# Patient Record
Sex: Female | Born: 1970 | Race: Black or African American | Hispanic: No | Marital: Single | Smoking: Never smoker
Health system: Southern US, Community
[De-identification: ages and names within clinical notes are randomized; demographics above are authoritative.]

## PROBLEM LIST (undated history)

## (undated) DIAGNOSIS — E119 Type 2 diabetes mellitus without complications: Secondary | ICD-10-CM

## (undated) DIAGNOSIS — E8881 Metabolic syndrome: Secondary | ICD-10-CM

## (undated) DIAGNOSIS — K5190 Ulcerative colitis, unspecified, without complications: Secondary | ICD-10-CM

## (undated) DIAGNOSIS — I10 Essential (primary) hypertension: Secondary | ICD-10-CM

## (undated) DIAGNOSIS — E785 Hyperlipidemia, unspecified: Secondary | ICD-10-CM

## (undated) HISTORY — PX: EYE SURGERY: SHX253

## (undated) HISTORY — DX: Essential (primary) hypertension: I10

## (undated) HISTORY — DX: Type 2 diabetes mellitus without complications: E11.9

## (undated) HISTORY — DX: Ulcerative colitis, unspecified, without complications: K51.90

## (undated) HISTORY — DX: Hyperlipidemia, unspecified: E78.5

---

## 2016-07-14 ENCOUNTER — Encounter (HOSPITAL_COMMUNITY): Payer: Self-pay

## 2016-07-14 ENCOUNTER — Emergency Department (HOSPITAL_COMMUNITY)
Admission: EM | Admit: 2016-07-14 | Discharge: 2016-07-14 | Disposition: A | Discharge status: Home / Self Care | Payer: PRIVATE HEALTH INSURANCE | Attending: Emergency Medicine | Admitting: Emergency Medicine

## 2016-07-14 DIAGNOSIS — E1165 Type 2 diabetes mellitus with hyperglycemia: Secondary | ICD-10-CM | POA: Insufficient documentation

## 2016-07-14 DIAGNOSIS — M79605 Pain in left leg: Secondary | ICD-10-CM | POA: Insufficient documentation

## 2016-07-14 DIAGNOSIS — R739 Hyperglycemia, unspecified: Secondary | ICD-10-CM

## 2016-07-14 HISTORY — DX: Metabolic syndrome: E88.81

## 2016-07-14 HISTORY — DX: Ulcerative colitis, unspecified, without complications: K51.90

## 2016-07-14 HISTORY — DX: Metabolic syndrome: E88.810

## 2016-07-14 LAB — URINALYSIS, ROUTINE W REFLEX MICROSCOPIC
Bilirubin Urine: NEGATIVE
HGB URINE DIPSTICK: NEGATIVE
KETONES UR: 5 mg/dL — AB
NITRITE: NEGATIVE
PH: 5 (ref 5.0–8.0)
Protein, ur: NEGATIVE mg/dL
SPECIFIC GRAVITY, URINE: 1.042 — AB (ref 1.005–1.030)

## 2016-07-14 LAB — BASIC METABOLIC PANEL
ANION GAP: 11 (ref 5–15)
BUN: 10 mg/dL (ref 6–20)
CALCIUM: 9.5 mg/dL (ref 8.9–10.3)
CO2: 22 mmol/L (ref 22–32)
Chloride: 100 mmol/L — ABNORMAL LOW (ref 101–111)
Creatinine, Ser: 0.66 mg/dL (ref 0.44–1.00)
GFR calc non Af Amer: >60 mL/min (ref >60)
Glucose, Bld: 261 mg/dL — ABNORMAL HIGH (ref 65–99)
Potassium: 3.9 mmol/L (ref 3.5–5.1)
SODIUM: 133 mmol/L — AB (ref 135–145)

## 2016-07-14 LAB — CBC
HCT: 42.9 % (ref 36.0–46.0)
HEMOGLOBIN: 14.5 g/dL (ref 12.0–15.0)
MCH: 28.3 pg (ref 26.0–34.0)
MCHC: 33.8 g/dL (ref 30.0–36.0)
MCV: 83.6 fL (ref 78.0–100.0)
PLATELETS: 383 10*3/uL (ref 150–400)
RBC: 5.13 MIL/uL — AB (ref 3.87–5.11)
RDW: 12.8 % (ref 11.5–15.5)
WBC: 8.3 10*3/uL (ref 4.0–10.5)

## 2016-07-14 LAB — CBG MONITORING, ED
Glucose-Capillary: 259 mg/dL — ABNORMAL HIGH (ref 65–99)
Glucose-Capillary: 220 mg/dL — ABNORMAL HIGH (ref 65–99)

## 2016-07-14 MED ORDER — IBUPROFEN 600 MG PO TABS: 600.0000 mg | ORAL_TABLET | Freq: Three times a day (TID) | ORAL | 0 refills | Status: DC | PRN

## 2016-07-14 MED ORDER — SODIUM CHLORIDE 0.9 % IV BOLUS (SEPSIS)
500.0000 mL | Freq: Once | INTRAVENOUS | Status: AC
  Administered 2016-07-14: 500 mL via INTRAVENOUS

## 2016-07-14 NOTE — Discharge Instructions (Signed)
Follow-up with a primary care doctor.

## 2016-07-14 NOTE — ED Triage Notes (Signed)
Per Pt, Pt is coming from home with complaints of hyperglycemia. Pt has hx of metabolic syndrome and reports body pain that has increased specifically in the left leg over the last two days. Pt reports some blurred vision.

## 2016-07-14 NOTE — ED Notes (Signed)
ED Provider at bedside. 

## 2016-07-14 NOTE — ED Provider Notes (Signed)
[facility] DEPT Provider Note   CSN: [id number] Arrival date & time: 07/14/16  1748  By signing my name below, I, Megan [name], attest that this documentation has been prepared under the direction and in the presence of Davonna Belling, MD.  Electronically Signed: Levester [name], Scribe. 07/14/2016. 7:30 PM.  History   Chief Complaint Chief Complaint  Patient presents with  . Hyperglycemia   Megan [name] is a 46 y.o. female with a PMHx consisting of DM on metformin, HTN, HDL, metabolic syndrome and UC who presents to the Emergency Department with multiple complaints, including hyperglycemia, abdominal pain, lower leg pain, nausea, confusion, blurry vision, increased urinary frequency and generalized weight-loss. x1 day of diarrhea, which resolved with no symptomatic treatment.  Pt denies experiencing any other acute sx, including vomiting.   Pt does not believe she is in an UC flare-up. Pt moved here 5 months ago and has not established routine medical care yet.  The history is provided by the patient. No language interpreter was used.   Past Medical History:  Diagnosis Date  . Metabolic syndrome   . Ulcerative colitis ([place])    There are no active problems to display for this patient.  Past Surgical History:  Procedure Laterality Date  . EYE SURGERY     OB History    No data available     Home Medications    Prior to Admission medications   Medication Sig Start Date End Date Taking? Authorizing Provider  ibuprofen (ADVIL,MOTRIN) 600 MG tablet Take 1 tablet (600 mg total) by mouth every 8 (eight) hours as needed. 07/14/16   Davonna Belling, MD   Family History No family history on file.  Social History Social History  Substance Use Topics  . Smoking status: Never Smoker  . Smokeless tobacco: Never Used  . Alcohol use Yes     Comment: occasionally    Allergies   Patient has no known allergies.  Review of Systems Review of Systems  Eyes:   Blurry vision  Gastrointestinal: Positive for abdominal pain, diarrhea (Resolved) and nausea. Negative for vomiting.  Genitourinary: Positive for frequency.  Musculoskeletal: Positive for myalgias.  Neurological:       Confusion   Physical Exam Updated Vital Signs BP 130/68 (BP Location: Left Arm)   Pulse 89   Temp 97.9 F (36.6 C) (Oral)   Resp 18   Ht 5' 10"  (1.778 m)   Wt 181 lb (82.1 kg)   LMP 07/09/2016   SpO2 98%   BMI 25.97 kg/m   Physical Exam  Constitutional: She is oriented to person, place, and time. She appears well-developed and well-nourished. No distress.  HENT:  Head: Normocephalic and atraumatic.  Cardiovascular: Normal rate.   Pulmonary/Chest: Effort normal.  Abdominal:  Mild suprapubic tenderness  Musculoskeletal:  No tenderness on the lower legs. Straight leg raise bilaterally. No peripheral edema.   Neurological: She is alert and oriented to person, place, and time.  Skin: Skin is warm and dry.  Psychiatric: She has a normal mood and affect.  Nursing note and vitals reviewed.  ED Treatments / Results  DIAGNOSTIC STUDIES: Oxygen Saturation is 98% on RA, nl by my interpretation.    COORDINATION OF CARE: 6:58 PM Discussed treatment plan with pt at bedside and pt agreed to plan. 8:01 PM Updated pt on current lab results. Awaiting urinalysis. She is pleasant and agrees with plan.   Labs (all labs ordered are listed, but only abnormal results are displayed) Labs Reviewed  BASIC METABOLIC PANEL - Abnormal; Notable for the following:       Result Value   Sodium 133 (*)    Chloride 100 (*)    Glucose, Bld 261 (*)    All other components within normal limits  CBC - Abnormal; Notable for the following:    RBC 5.13 (*)    All other components within normal limits  URINALYSIS, ROUTINE W REFLEX MICROSCOPIC - Abnormal; Notable for the following:    APPearance HAZY (*)    Specific Gravity, Urine 1.042 (*)    Glucose, UA >=500 (*)    Ketones, ur 5 (*)      Leukocytes, UA TRACE (*)    Bacteria, UA RARE (*)    Squamous Epithelial / LPF 0-5 (*)    All other components within normal limits  CBG MONITORING, ED - Abnormal; Notable for the following:    Glucose-Capillary 259 (*)    All other components within normal limits  CBG MONITORING, ED - Abnormal; Notable for the following:    Glucose-Capillary 220 (*)    All other components within normal limits   EKG  EKG Interpretation None      Radiology No results found.  Procedures Procedures (including critical care time)  Medications Ordered in ED Medications  sodium chloride 0.9 % bolus 500 mL (0 mLs Intravenous Stopped 07/14/16 2013)    Initial Impression / Assessment and Plan / ED Course  I have reviewed the triage vital signs and the nursing notes.  Pertinent labs & imaging results that were available during my care of the patient were reviewed by me and considered in my medical decision making (see chart for details).     Patient with mild hyperglycemia. Not in DKA. Labs overall reassuring. Does have some pain however in her left thigh. No trauma. No swelling of the leg. Overall benign exam. Doubt DVT with no swelling and really no tenderness. Give NSAIDs and will have follow-up with primary care doctor.  Final Clinical Impressions(s) / ED Diagnoses   Final diagnoses:  Hyperglycemia  Left leg pain    New Prescriptions New Prescriptions   IBUPROFEN (ADVIL,MOTRIN) 600 MG TABLET    Take 1 tablet (600 mg total) by mouth every 8 (eight) hours as needed.   I personally performed the services described in this documentation, which was scribed in my presence. The recorded information has been reviewed and is accurate.      Davonna Belling, MD 07/14/16 2027

## 2018-10-27 ENCOUNTER — Ambulatory Visit (INDEPENDENT_AMBULATORY_CARE_PROVIDER_SITE_OTHER): Payer: Managed Care, Other (non HMO) | Admitting: Family Medicine

## 2018-10-27 ENCOUNTER — Encounter: Payer: Self-pay | Admitting: Family Medicine

## 2018-10-27 ENCOUNTER — Other Ambulatory Visit: Payer: Self-pay

## 2018-10-27 VITALS — BP 132/80 | HR 84 | Temp 98.5°F | Ht 70.0 in | Wt 177.4 lb

## 2018-10-27 DIAGNOSIS — E785 Hyperlipidemia, unspecified: Secondary | ICD-10-CM

## 2018-10-27 DIAGNOSIS — E039 Hypothyroidism, unspecified: Secondary | ICD-10-CM

## 2018-10-27 DIAGNOSIS — I10 Essential (primary) hypertension: Secondary | ICD-10-CM | POA: Insufficient documentation

## 2018-10-27 DIAGNOSIS — E1169 Type 2 diabetes mellitus with other specified complication: Secondary | ICD-10-CM | POA: Diagnosis not present

## 2018-10-27 DIAGNOSIS — R928 Other abnormal and inconclusive findings on diagnostic imaging of breast: Secondary | ICD-10-CM | POA: Diagnosis not present

## 2018-10-27 DIAGNOSIS — S80861A Insect bite (nonvenomous), right lower leg, initial encounter: Secondary | ICD-10-CM

## 2018-10-27 DIAGNOSIS — R739 Hyperglycemia, unspecified: Secondary | ICD-10-CM | POA: Diagnosis not present

## 2018-10-27 DIAGNOSIS — E119 Type 2 diabetes mellitus without complications: Secondary | ICD-10-CM

## 2018-10-27 DIAGNOSIS — E0590 Thyrotoxicosis, unspecified without thyrotoxic crisis or storm: Secondary | ICD-10-CM | POA: Insufficient documentation

## 2018-10-27 DIAGNOSIS — W57XXXA Bitten or stung by nonvenomous insect and other nonvenomous arthropods, initial encounter: Secondary | ICD-10-CM

## 2018-10-27 LAB — MICROALBUMIN / CREATININE URINE RATIO
Creatinine,U: 55 mg/dL
Microalb Creat Ratio: 1.3 mg/g (ref 0.0–30.0)
Microalb, Ur: <0.7 mg/dL (ref 0.0–1.9)

## 2018-10-27 LAB — POCT GLYCOSYLATED HEMOGLOBIN (HGB A1C): Hemoglobin A1C: 10.7 % — AB (ref 4.0–5.6)

## 2018-10-27 LAB — TSH: TSH: 0.03 u[IU]/mL — ABNORMAL LOW (ref 0.35–4.50)

## 2018-10-27 MED ORDER — TRIAMCINOLONE ACETONIDE 0.1 % EX CREA: 1.0000 "application " | TOPICAL_CREAM | Freq: Every day | CUTANEOUS | 0 refills | Status: DC | PRN

## 2018-10-27 MED ORDER — EMPAGLIFLOZIN 25 MG PO TABS: 25.0000 mg | ORAL_TABLET | Freq: Every day | ORAL | 1 refills | Status: DC

## 2018-10-27 MED ORDER — ENALAPRIL MALEATE 10 MG PO TABS: 10.0000 mg | ORAL_TABLET | Freq: Every day | ORAL | 1 refills | Status: DC

## 2018-10-27 MED ORDER — SIMVASTATIN 10 MG PO TABS: 10.0000 mg | ORAL_TABLET | Freq: Every day | ORAL | 3 refills | Status: DC

## 2018-10-27 NOTE — Progress Notes (Signed)
HPI:   [name] is a 48 y.o. female, who is here today to establish care.  Former PCP: Megan [name] at [facility]. Last preventive routine visit: 2019  Chronic medical problems: Hypertension,DM II, UC,and hypothyroidism.  DM II,Dx in 2015. Her health insurance does not covered [place]. HgA1C done on 09/12/18 was 10.7. She is not checking BS's.  Concerns today: Medications refill.  HLD, she is on Simvastatin 10 mg daily.  08/2018 TC 206,LDL 137,HLD 43,and TG 140  HTN,she is on Enalapril 10 mg daily. Denies severe/frequent headache, visual changes, chest pain, dyspnea, palpitation, claudication, focal weakness, or edema. She does not check BP's. Last BMP in 08/2018: Cr 0.54 and e GFR 129.   Hypothyroidism: Currently Levothyroxine 150 mg daily.  + Goiter. Tolerating medication well, no side effects reported. She has not noted dysphagia, palpitations, abdominal pain, changes in bowel habits, tremor, cold/heat intolerance, or abnormal weight loss. 09/12/18 TSH low at 0.04. Abnormal thyroid abd peroxidases thyroglobulins.  On 09/12/18 she also had CBC done,normal except for palettes elevated at 421 and RBC's 5.13.  Today she requesting treatment for mosquito bites. Pruritic lesion scattered on lower and upper extremities. No fever,chills,body aches,or fatigue. She has not tried OTC medications.  Reporting Hx of abnormal mammogram. Birads 3, sh was due for mammogram in 05/2018. She has not noted breast masses,nipple discharge,or skin changes.  Review of Systems  Constitutional: Negative for activity change, appetite change, fatigue, fever and unexpected weight change.  HENT: Negative for mouth sores, nosebleeds and sore throat.   Eyes: Negative for redness and visual disturbance.  Respiratory: Negative for cough and wheezing.   Gastrointestinal: Negative for abdominal pain, nausea and vomiting.       Negative for changes in bowel habits.   Genitourinary: Negative for decreased urine volume, dysuria and hematuria.  Musculoskeletal: Negative for gait problem and myalgias.  Skin: Positive for rash. Negative for wound.  Neurological: Negative for syncope, facial asymmetry and numbness.  Rest see pertinent positives and negatives per HPI.   No current outpatient medications on file prior to visit.   No current facility-administered medications on file prior to visit.     Past Medical History:  Diagnosis Date  . Diabetes mellitus without complication ([place])   . Hyperlipidemia   . Hypertension   . Metabolic syndrome   . Ulcerative colitis ([place])   . Ulcerative colitis, acute ([place])    No Known Allergies  Family History  Problem Relation Age of Onset  . Asthma Mother   . COPD Mother   . Hypertension Mother     Social History   Socioeconomic History  . Marital status: Single    Spouse name: Not on file  . Number of children: 1  . Years of education: Not on file  . Highest education level: Not on file  Occupational History  . Not on file  Social Needs  . Financial resource strain: Not on file  . Food insecurity    Worry: Not on file    Inability: Not on file  . Transportation needs    Medical: Not on file    Non-medical: Not on file  Tobacco Use  . Smoking status: Never Smoker  . Smokeless tobacco: Never Used  Substance and Sexual Activity  . Alcohol use: Yes    Comment: occasionally   . Drug use: No  . Sexual activity: Not Currently  Lifestyle  . Physical activity    Days per week: Not on  file    Minutes per session: Not on file  . Stress: Not on file  Relationships  . Social Herbalist on phone: Not on file    Gets together: Not on file    Attends religious service: Not on file    Active member of club or organization: Not on file    Attends meetings of clubs or organizations: Not on file    Relationship status: Not on file  Other Topics Concern  . Not on file  Social History  Narrative  . Not on file    Vitals:   10/27/18 0835  BP: 132/80  Pulse: 84  Temp: 98.5 F (36.9 C)  SpO2: 96%    Body mass index is 25.45 kg/m.   Physical Exam  Nursing note and vitals reviewed. Constitutional: She is oriented to person, place, and time. She appears well-developed and well-nourished. No distress.  HENT:  Head: Normocephalic and atraumatic.  Mouth/Throat: Oropharynx is clear and moist and mucous membranes are normal.  Eyes: Pupils are equal, round, and reactive to light. Conjunctivae are normal.  Neck: Thyromegaly present.  Cardiovascular: Normal rate and regular rhythm.  No murmur heard. Pulses:      Dorsalis pedis pulses are 2+ on the right side and 2+ on the left side.  Respiratory: Effort normal and breath sounds normal. No respiratory distress.  GI: Soft. She exhibits no mass. There is no hepatomegaly. There is no abdominal tenderness.  Musculoskeletal:        General: No edema.  Lymphadenopathy:    She has no cervical adenopathy.  Neurological: She is alert and oriented to person, place, and time. She has normal strength. No cranial nerve deficit. Gait normal.  Skin: Skin is warm. Rash noted. Rash is urticarial. No erythema.     Right lower and upper extremities,urticarial like lesion, 1-1.5 cm.  Psychiatric: She has a normal mood and affect.  Well groomed, good eye contact.   Diabetic Foot Exam - Simple   Simple Foot Form Diabetic Foot exam was performed with the following findings: Yes 10/27/2018  9:19 AM  Visual Inspection No deformities, no ulcerations, no other skin breakdown bilaterally: Yes Sensation Testing Intact to touch and monofilament testing bilaterally: Yes Pulse Check Posterior Tibialis and Dorsalis pulse intact bilaterally: Yes Comments     ASSESSMENT AND PLAN:  Megan [name] was seen today for establish care.  Diagnoses and all orders for this visit: Lab Results  Component Value Date   HGBA1C 10.7 (A) 10/27/2018    Lab Results  Component Value Date   TSH 0.03 (L) 10/27/2018   Lab Results  Component Value Date   MICROALBUR <0.7 10/27/2018    Hypothyroidism, unspecified type TSH abnormal in 08/2018. No changes in current management, will follow TSH done today and will give further recommendations accordingly.  -     TSH -     levothyroxine (SYNTHROID) 125 MCG tablet; Take 1 tablet (125 mcg total) by mouth daily.  Hypertension, essential, benign Adequately controlled. No changes in current management. DASH diet recommended. Eye exam is due. F/U in 4 months, before if needed.  -     enalapril (VASOTEC) 10 MG tablet; Take 1 tablet (10 mg total) by mouth daily.  Type 2 diabetes mellitus with other specified complication, without long-term current use of insulin (HCC) HgA1C is not at goal. Jardiance 25 mg daily added. We may need another agent.  Regular exercise and healthy diet with avoidance of added sugar food  intake is an important part of treatment and recommended. Annual eye exam (needs appt), periodic dental and foot care recommended. F/U in 3 months  -     Microalbumin / creatinine urine ratio  Abnormal mammogram -     MM DIAG BREAST TOMO BILATERAL; Future  Hyperglycemia -     POC HgB A1c  Insect bite of multiple sites of right lower extremity with local reaction, initial encounter Prevention, consider repellents. Side effects of topical steroid,so recommend a small amount daily pr.  -     triamcinolone cream (KENALOG) 0.1 %; Apply 1 application topically daily as needed.  Hyperlipidemia associated with type 2 diabetes mellitus ([place]) Continue current management. Low fat diet also recommended. F/U in 02/2019.  -     simvastatin (ZOCOR) 10 MG tablet; Take 1 tablet (10 mg total) by mouth at bedtime.  Return in about 3 months (around 01/27/2019).    G. Martinique, MD  [facility]. [place] office.

## 2018-10-27 NOTE — Patient Instructions (Addendum)
A few things to remember from today's visit:   Diabetes mellitus without complication ([place]) - Plan: POC HgB A1c, Microalbumin / creatinine urine ratio  Hypothyroidism, unspecified type - Plan: TSH  Hypertension, essential, benign  Type 2 diabetes mellitus with other specified complication, without long-term current use of insulin (HCC)  Abnormal mammogram - Plan: MS DIGITAL DIAG TOMO BILAT  Jardiance was sent to your pharmacy. Simvastatin 10 mg added today. Rest of your medications are unchanged.  You need an eye exam.  Please be sure medication list is accurate. If a new problem present, please set up appointment sooner than planned today.

## 2018-10-28 ENCOUNTER — Telehealth: Payer: Self-pay | Admitting: Family Medicine

## 2018-10-28 NOTE — Telephone Encounter (Signed)
empagliflozin (JARDIANCE) 25 MG TABS tablet  [place] ([place]), [place] - [address] [phone] (Phone) [phone] (Fax)   Note this was sent in yesterday but pt states that when went for PU that PA is needed and she is completely out. Could this pls be expedited?

## 2018-10-28 NOTE — Telephone Encounter (Signed)
See note

## 2018-10-28 NOTE — Telephone Encounter (Signed)
Tried to call pt but no answer and mail box is full.

## 2018-10-31 MED ORDER — LEVOTHYROXINE SODIUM 125 MCG PO TABS: 125.0000 ug | ORAL_TABLET | Freq: Every day | ORAL | 0 refills | Status: DC

## 2018-11-01 ENCOUNTER — Ambulatory Visit: Payer: Self-pay | Admitting: Family Medicine

## 2018-11-02 ENCOUNTER — Other Ambulatory Visit: Payer: Self-pay | Admitting: Family Medicine

## 2018-11-02 DIAGNOSIS — R928 Other abnormal and inconclusive findings on diagnostic imaging of breast: Secondary | ICD-10-CM

## 2018-11-02 DIAGNOSIS — N6489 Other specified disorders of breast: Secondary | ICD-10-CM

## 2018-11-02 NOTE — Telephone Encounter (Signed)
PA started for medication.

## 2018-11-03 NOTE — Telephone Encounter (Signed)
Spoke with patient and informed her that PA has been initiated and waiting on decision from [employer]. Patient verbalized understanding.

## 2018-11-05 ENCOUNTER — Other Ambulatory Visit: Payer: Self-pay | Admitting: *Deleted

## 2018-11-05 DIAGNOSIS — E1169 Type 2 diabetes mellitus with other specified complication: Secondary | ICD-10-CM

## 2018-11-05 MED ORDER — FARXIGA 10 MG PO TABS: 10.0000 mg | ORAL_TABLET | Freq: Every day | ORAL | 3 refills | Status: DC

## 2018-11-05 NOTE — Telephone Encounter (Signed)
Spoke with patient and informed her that PA for Vania Rea was denied and that Dr. Martinique suggested FARXIGA 10 MG as an alternative.Patient verbalized understanding and requested Rx for Farxiga 10 mg be sent to the pharmacy. Rx sent.

## 2018-11-08 ENCOUNTER — Other Ambulatory Visit: Payer: Self-pay

## 2018-11-08 ENCOUNTER — Ambulatory Visit
Admission: RE | Admit: 2018-11-08 | Discharge: 2018-11-08 | Disposition: A | Discharge status: Home / Self Care | Payer: Managed Care, Other (non HMO) | Source: Ambulatory Visit | Attending: Family Medicine | Admitting: Family Medicine

## 2018-11-08 ENCOUNTER — Other Ambulatory Visit: Payer: Self-pay | Admitting: Family Medicine

## 2018-11-08 DIAGNOSIS — N6489 Other specified disorders of breast: Secondary | ICD-10-CM

## 2018-11-08 DIAGNOSIS — R928 Other abnormal and inconclusive findings on diagnostic imaging of breast: Secondary | ICD-10-CM

## 2018-11-08 MED ORDER — SITAGLIPTIN PHOS-METFORMIN HCL 50-1000 MG PO TABS: 1.0000 | ORAL_TABLET | Freq: Two times a day (BID) | ORAL | 0 refills | Status: DC

## 2018-11-09 ENCOUNTER — Other Ambulatory Visit: Payer: Self-pay | Admitting: *Deleted

## 2018-11-09 MED ORDER — METFORMIN HCL 500 MG PO TABS: 500.0000 mg | ORAL_TABLET | Freq: Two times a day (BID) | ORAL | 3 refills | Status: DC

## 2018-11-09 MED ORDER — OZEMPIC (0.25 OR 0.5 MG/DOSE) 2 MG/1.5ML ~~LOC~~ SOPN: 0.2500 mg | PEN_INJECTOR | SUBCUTANEOUS | 3 refills | Status: DC

## 2018-11-15 NOTE — Telephone Encounter (Signed)
Pt called back in, she said that there is a recall on Metformin, pt says that she was told by  her insurance that the information that is being provided by office isn't enough so that they are unable to approve Jardiance. Pt says that her insurance runs out on Sept 1 so she is really hoping to get a new medication by then. Pt would like to know if office could reach out to insurance to provide information that's needed for approval?

## 2018-11-16 ENCOUNTER — Other Ambulatory Visit: Payer: Self-pay | Admitting: *Deleted

## 2018-11-16 ENCOUNTER — Telehealth: Payer: Self-pay | Admitting: Family Medicine

## 2018-11-16 MED ORDER — JARDIANCE 25 MG PO TABS: 25.0000 mg | ORAL_TABLET | Freq: Every day | ORAL | 3 refills | Status: DC

## 2018-11-16 NOTE — Telephone Encounter (Signed)
Spoke with Vira Agar from [employer] and was able to get Megan [name] approved starting today for 1 year, reference # [id number]. Called patient and informed her that she should be able to pick up the medication from the pharmacy.

## 2018-11-16 NOTE — Telephone Encounter (Signed)
Noted. Patient aware.

## 2018-11-16 NOTE — Telephone Encounter (Signed)
Megan [name] with [employer].  States that Prior Auth for empagliflozin (JARDIANCE) 25 MG TABS tablet Has been approved indefinitely.  They will fax over information.

## 2018-11-16 NOTE — Telephone Encounter (Signed)
Spoke with Milta Deiters from [facility] and he stated that he would fax over a PA form to clinic for completion.

## 2018-11-17 NOTE — Telephone Encounter (Signed)
Spoke with patient and she stated that after Vania Rea was approved, the cost was $157 and she cannot afford that. Patient states that she started a new job and that insurance ended on 11/16/2018. Patient stated that she would take Metformin and Ozempic as previously advised before PA of Jardiance was done. Patient stated that she had some left in [place] that her daughter can bring back to her of the [facility]. Patient will pick up 2 boxes from the office.

## 2018-11-17 NOTE — Telephone Encounter (Signed)
Pt needs Megan [name] to return call asap, due to her Job this script needed to go in today, also still reaching out for price relief. Please return call at [phone]

## 2018-11-18 NOTE — Telephone Encounter (Signed)
Legrand Como from [employer] called and suggest [employer] They have a discount card with no ins. Sates pt no longer has insurance! [email or web address]

## 2018-11-30 NOTE — Telephone Encounter (Signed)
Jardiance 25 mg can be switched to Farxiga 10 mg. Thanks, BJ

## 2018-11-30 NOTE — Telephone Encounter (Signed)
Okay to switch?  

## 2018-12-01 ENCOUNTER — Other Ambulatory Visit: Payer: Self-pay | Admitting: *Deleted

## 2018-12-01 DIAGNOSIS — E1169 Type 2 diabetes mellitus with other specified complication: Secondary | ICD-10-CM

## 2018-12-01 MED ORDER — FARXIGA 10 MG PO TABS: 10.0000 mg | ORAL_TABLET | Freq: Every day | ORAL | 3 refills | Status: DC

## 2018-12-01 NOTE — Telephone Encounter (Signed)
Farxiga 10 mg sent to the pharmacy.

## 2019-02-25 ENCOUNTER — Other Ambulatory Visit: Payer: Self-pay

## 2019-02-25 DIAGNOSIS — Z20822 Contact with and (suspected) exposure to covid-19: Secondary | ICD-10-CM

## 2019-02-28 LAB — NOVEL CORONAVIRUS, NAA: SARS-CoV-2, NAA: NOT DETECTED

## 2019-03-01 ENCOUNTER — Telehealth: Payer: Self-pay | Admitting: General Practice

## 2019-03-01 NOTE — Telephone Encounter (Signed)
Negative COVID results given. Patient results "NOT Detected." Caller expressed understanding. ° °

## 2019-03-02 ENCOUNTER — Other Ambulatory Visit: Payer: Self-pay | Admitting: Family Medicine

## 2019-03-02 DIAGNOSIS — E039 Hypothyroidism, unspecified: Secondary | ICD-10-CM

## 2019-04-05 ENCOUNTER — Ambulatory Visit: Payer: PRIVATE HEALTH INSURANCE | Attending: Internal Medicine

## 2019-04-05 DIAGNOSIS — U071 COVID-19: Secondary | ICD-10-CM | POA: Insufficient documentation

## 2019-04-05 DIAGNOSIS — Z20822 Contact with and (suspected) exposure to covid-19: Secondary | ICD-10-CM

## 2019-04-06 LAB — NOVEL CORONAVIRUS, NAA: SARS-CoV-2, NAA: DETECTED — AB

## 2019-04-07 ENCOUNTER — Other Ambulatory Visit: Payer: Self-pay | Admitting: Physician Assistant

## 2019-04-07 DIAGNOSIS — U071 COVID-19: Secondary | ICD-10-CM

## 2019-04-07 DIAGNOSIS — E1169 Type 2 diabetes mellitus with other specified complication: Secondary | ICD-10-CM

## 2019-04-07 NOTE — Progress Notes (Signed)
[place],   You have been scheduled to receive bamlanivumab (the monoclonal antibody we discussed) on Sunday 04/10/19 at 1030 AM.  Please arrive 15 minutes early.   The address for the infusion clinic site is:  [place], [place] (previously this was the [facility]   When you drive in the main entrance you will see security -  tell them they are there to get an infusion and they will take you to where you need to go.   If you have questions please call [phone].   Should you develop worsening shortness of breath or symptoms over the holiday weekend please do not wait for this appointment and go to the Emergency room for evaluation and treatment.   The day of your visit you should: Marland Kitchen Get plenty of rest the night before and drink plenty of water . Eat a light meal/snack before coming and take your medications as prescribed  . Wear warm, comfortable clothes with a shirt that can roll-up over the elbow (will need IV start).  . Wear a mask  . Consider bringing some activity to help pass the time  I hope this helps find you feeling better,  Montey Hora, PA - C

## 2019-04-10 ENCOUNTER — Ambulatory Visit (HOSPITAL_COMMUNITY)
Admission: RE | Admit: 2019-04-10 | Discharge: 2019-04-10 | Disposition: A | Discharge status: Home / Self Care | Payer: PRIVATE HEALTH INSURANCE | Source: Ambulatory Visit | Attending: Pulmonary Disease | Admitting: Pulmonary Disease

## 2019-04-10 DIAGNOSIS — U071 COVID-19: Secondary | ICD-10-CM | POA: Diagnosis present

## 2019-04-10 DIAGNOSIS — Z23 Encounter for immunization: Secondary | ICD-10-CM | POA: Diagnosis not present

## 2019-04-10 DIAGNOSIS — E1169 Type 2 diabetes mellitus with other specified complication: Secondary | ICD-10-CM | POA: Diagnosis present

## 2019-04-10 MED ORDER — SODIUM CHLORIDE 0.9 % IV SOLN
INTRAVENOUS | Status: DC | PRN
  Administered 2019-04-10: 11:00:00 250 mL via INTRAVENOUS

## 2019-04-10 MED ORDER — EPINEPHRINE 0.3 MG/0.3ML IJ SOAJ: 0.3000 mg | Freq: Once | INTRAMUSCULAR | Status: DC | PRN

## 2019-04-10 MED ORDER — METHYLPREDNISOLONE SODIUM SUCC 125 MG IJ SOLR: 125.0000 mg | Freq: Once | INTRAMUSCULAR | Status: DC | PRN

## 2019-04-10 MED ORDER — DIPHENHYDRAMINE HCL 50 MG/ML IJ SOLN: 50.0000 mg | Freq: Once | INTRAMUSCULAR | Status: DC | PRN

## 2019-04-10 MED ORDER — ALBUTEROL SULFATE HFA 108 (90 BASE) MCG/ACT IN AERS: 2.0000 | INHALATION_SPRAY | Freq: Once | RESPIRATORY_TRACT | Status: DC | PRN

## 2019-04-10 MED ORDER — FAMOTIDINE IN NACL 20-0.9 MG/50ML-% IV SOLN: 20.0000 mg | Freq: Once | INTRAVENOUS | Status: DC | PRN

## 2019-04-10 MED ORDER — SODIUM CHLORIDE 0.9 % IV SOLN
700.0000 mg | Freq: Once | INTRAVENOUS | Status: AC
  Administered 2019-04-10: 12:00:00 700 mg via INTRAVENOUS
  Filled 2019-04-10: qty 20

## 2019-04-10 NOTE — Progress Notes (Signed)
  Diagnosis: COVID-19  Physician: Dr. Joya Gaskins Procedure: [facility]fusion [facility] Med: bamlanivimab infusion - Provided patient with bamlanimivab fact sheet for patients, parents and caregivers prior to infusion.  Complications: No immediate complications noted.  Discharge: Discharged home   Acquanetta Chain 04/10/2019

## 2019-04-10 NOTE — Discharge Instructions (Signed)

## 2019-06-08 ENCOUNTER — Other Ambulatory Visit: Payer: Self-pay | Admitting: Family Medicine

## 2019-06-08 DIAGNOSIS — E039 Hypothyroidism, unspecified: Secondary | ICD-10-CM

## 2019-06-10 ENCOUNTER — Ambulatory Visit (HOSPITAL_COMMUNITY)
Admission: EM | Admit: 2019-06-10 | Discharge: 2019-06-10 | Disposition: A | Discharge status: Home / Self Care | Payer: Self-pay

## 2019-06-10 ENCOUNTER — Encounter (HOSPITAL_COMMUNITY): Payer: Self-pay

## 2019-06-10 ENCOUNTER — Other Ambulatory Visit: Payer: Self-pay

## 2019-06-10 DIAGNOSIS — E1165 Type 2 diabetes mellitus with hyperglycemia: Secondary | ICD-10-CM

## 2019-06-10 DIAGNOSIS — H1032 Unspecified acute conjunctivitis, left eye: Secondary | ICD-10-CM

## 2019-06-10 DIAGNOSIS — R739 Hyperglycemia, unspecified: Secondary | ICD-10-CM

## 2019-06-10 DIAGNOSIS — H5789 Other specified disorders of eye and adnexa: Secondary | ICD-10-CM

## 2019-06-10 DIAGNOSIS — L568 Other specified acute skin changes due to ultraviolet radiation: Secondary | ICD-10-CM

## 2019-06-10 LAB — CBG MONITORING, ED: Glucose-Capillary: 349 mg/dL — ABNORMAL HIGH (ref 70–99)

## 2019-06-10 LAB — GLUCOSE, CAPILLARY: Glucose-Capillary: 349 mg/dL — ABNORMAL HIGH (ref 70–99)

## 2019-06-10 MED ORDER — TOBRAMYCIN 0.3 % OP SOLN: 2.0000 [drp] | OPHTHALMIC | 0 refills | Status: DC

## 2019-06-10 MED ORDER — EYE WASH OPHTH SOLN
OPHTHALMIC | Status: AC
  Filled 2019-06-10: qty 118

## 2019-06-10 NOTE — Discharge Instructions (Signed)
Make sure you contact your eye doctor today to request an urgent follow up appointment for your eye. For now, use the antibiotic eye drops to help with your eye infection/redness.

## 2019-06-10 NOTE — ED Provider Notes (Signed)
[place]   MRN: [id number] DOB: Dec 02, 1970  Subjective:   Megan [name] is a 49 y.o. female presenting for 3 day hx of acute onset red eye of left eye. Has also developed some photosensitivity.  Denies fever, eye drainage, pain with eye movement, runny or stuffy nose, throat pain, cough, eye trauma.  Denies vision change.  Does not wear contacts.  Patient is diabetic, would like to have her blood sugar checked.  No current facility-administered medications for this encounter.  Current Outpatient Medications:  .  Empagliflozin (JARDIANCE PO), Take by mouth., Disp: , Rfl:  .  enalapril (VASOTEC) 10 MG tablet, Take 1 tablet (10 mg total) by mouth daily., Disp: 90 tablet, Rfl: 1 .  levothyroxine (SYNTHROID) 125 MCG tablet, Take 1 tablet by mouth once daily, Disp: 90 tablet, Rfl: 0 .  metFORMIN (GLUCOPHAGE) 500 MG tablet, Take 500 mg by mouth 2 (two) times daily., Disp: , Rfl:  .  Semaglutide,0.25 or 0.5MG/DOS, (OZEMPIC, 0.25 OR 0.5 MG/DOSE,) 2 MG/1.5ML SOPN, Inject 0.25 mg into the skin once a week. 0.25 mg x 2 weeks then 0.5 mg weekly, Disp: 1.5 mL, Rfl: 3 .  simvastatin (ZOCOR) 10 MG tablet, Take 1 tablet (10 mg total) by mouth at bedtime., Disp: 90 tablet, Rfl: 3 .  dapagliflozin propanediol (FARXIGA) 10 MG TABS tablet, Take 10 mg by mouth daily before breakfast., Disp: 30 tablet, Rfl: 3 .  triamcinolone cream (KENALOG) 0.1 %, Apply 1 application topically daily as needed., Disp: 30 g, Rfl: 0   No Known Allergies  Past Medical History:  Diagnosis Date  . Diabetes mellitus without complication ([place])   . Hyperlipidemia   . Hypertension   . Metabolic syndrome   . Ulcerative colitis ([place])   . Ulcerative colitis, acute [facility])      Past Surgical History:  Procedure Laterality Date  . EYE SURGERY      Family History  Problem Relation Age of Onset  . Asthma Mother   . COPD Mother   . Hypertension Mother     Social History   Tobacco Use  . Smoking status:  Never Smoker  . Smokeless tobacco: Never Used  Substance Use Topics  . Alcohol use: Yes    Comment: occasionally   . Drug use: No    ROS   Objective:   Vitals: BP (!) 140/97 (BP Location: Left Arm)   Pulse 100   Temp 98.6 F (37 C) (Oral)   Resp 18   LMP 05/25/2019   SpO2 99%   Physical Exam Constitutional:      General: She is not in acute distress.    Appearance: Normal appearance. She is well-developed. She is not ill-appearing, toxic-appearing or diaphoretic.  HENT:     Head: Normocephalic and atraumatic.     Nose: Nose normal.     Mouth/Throat:     Mouth: Mucous membranes are moist.     Pharynx: Oropharynx is clear.  Eyes:     General: No scleral icterus.       Right eye: No foreign body, discharge or hordeolum.        Left eye: No foreign body, discharge or hordeolum.     Extraocular Movements: Extraocular movements intact.     Conjunctiva/sclera:     Right eye: Right conjunctiva is not injected. No chemosis, exudate or hemorrhage.    Left eye: Left conjunctiva is injected. No chemosis, exudate or hemorrhage.    Pupils: Pupils are equal, round, and reactive to  light.  Cardiovascular:     Rate and Rhythm: Normal rate.  Pulmonary:     Effort: Pulmonary effort is normal.  Skin:    General: Skin is warm and dry.  Neurological:     General: No focal deficit present.     Mental Status: She is alert and oriented to person, place, and time.  Psychiatric:        Mood and Affect: Mood normal.        Behavior: Behavior normal.    Tono-Pen averaged 19 mmHg for left eye, 18 for right eye.  Results for orders placed or performed during the hospital encounter of 06/10/19 (from the past 24 hour(s))  POC CBG monitoring     Status: Abnormal   Collection Time: 06/10/19  2:22 PM  Result Value Ref Range   Glucose-Capillary 349 (H) 70 - 99 mg/dL   Comment 1 Notify RN   Glucose, capillary     Status: Abnormal   Collection Time: 06/10/19  2:22 PM  Result Value Ref Range    Glucose-Capillary 349 (H) 70 - 99 mg/dL   Comment 1 Notify RN    Eye Exam: Eyelids everted and swept for foreign body. The eye was anesthetized with 2 drops of tetracaine and stained with fluorescein. Examination under woods lamp does not reveal a foreign body or area of increased stain uptake. The eye was then irrigated copiously with saline.   Assessment and Plan :   1. Acute conjunctivitis of left eye, unspecified acute conjunctivitis type   2. Photosensitivity   3. Redness of left eye   4. Hyperglycemia   5. Uncontrolled type 2 diabetes mellitus with hyperglycemia ([place])     Recommended patient contact one of her eye doctors (she has 2) today to set up an urgent follow-up.  Eyes injected, otherwise exam is fairly unremarkable.  Will start tobramycin to address bacterial conjunctivitis.  Strict ER precautions. Counseled patient on potential for adverse effects with medications prescribed today, patient verbalized understanding.    Jaynee Eagles, PA-C 06/10/19 1451

## 2019-06-10 NOTE — ED Triage Notes (Signed)
Pt c/o sore throat, left eye pain/redness. Denies drainage from left eye or change of vision since eye pain onset. Denies HA, cough, fever, chills, abdom pain, n/v/d.   Pt states she contracted COVID Jan 18 and received outpt therapy.  Pt requests CBG testing b/c "I haven't checked my sugar since last week.".   Left eye appears red/watery.

## 2019-06-30 ENCOUNTER — Ambulatory Visit: Payer: PRIVATE HEALTH INSURANCE

## 2019-07-12 ENCOUNTER — Ambulatory Visit: Payer: PRIVATE HEALTH INSURANCE | Attending: Internal Medicine

## 2019-07-12 DIAGNOSIS — Z23 Encounter for immunization: Secondary | ICD-10-CM

## 2019-07-12 NOTE — Progress Notes (Signed)
   Covid-19 Vaccination Clinic  Name:  Megan [name]    MRN: [id number] DOB: 01/30/1971  07/12/2019  Megan [name] was observed post Covid-19 immunization for 15 minutes without incident. She was provided with Vaccine Information Sheet and instruction to access the V-Safe system.   Megan [name] was instructed to call 911 with any severe reactions post vaccine: Marland Kitchen Difficulty breathing  . Swelling of face and throat  . A fast heartbeat  . A bad rash all over body  . Dizziness and weakness   Immunizations Administered    Name Date Dose VIS Date Route   Pfizer COVID-19 Vaccine 07/12/2019 11:25 AM 0.3 mL 05/11/2018 Intramuscular   Manufacturer: [place]   Lot: [id number]   [place]: [address]-3

## 2019-07-25 ENCOUNTER — Ambulatory Visit: Payer: PRIVATE HEALTH INSURANCE

## 2019-08-01 ENCOUNTER — Ambulatory Visit: Payer: PRIVATE HEALTH INSURANCE | Attending: Internal Medicine

## 2019-08-01 DIAGNOSIS — Z23 Encounter for immunization: Secondary | ICD-10-CM

## 2019-08-01 NOTE — Progress Notes (Signed)
   Covid-19 Vaccination Clinic  Name:  Megan [name]    MRN: [id number] DOB: 10/07/1970  08/01/2019  [name] was observed post Covid-19 immunization for 15 minutes without incident. She was provided with Vaccine Information Sheet and instruction to access the V-Safe system.   [name] was instructed to call 911 with any severe reactions post vaccine: Marland Kitchen Difficulty breathing  . Swelling of face and throat  . A fast heartbeat  . A bad rash all over body  . Dizziness and weakness   Immunizations Administered    Name Date Dose VIS Date Route   Pfizer COVID-19 Vaccine 08/01/2019 11:38 AM 0.3 mL 05/11/2018 Intramuscular   Manufacturer: [place]   Lot: [id number]   [place]: [id number]

## 2019-08-16 ENCOUNTER — Other Ambulatory Visit: Payer: Self-pay

## 2019-08-16 ENCOUNTER — Ambulatory Visit (HOSPITAL_COMMUNITY)
Admission: EM | Admit: 2019-08-16 | Discharge: 2019-08-16 | Disposition: A | Discharge status: Home / Self Care | Payer: 59 | Attending: Family Medicine | Admitting: Family Medicine

## 2019-08-16 ENCOUNTER — Encounter (HOSPITAL_COMMUNITY): Payer: Self-pay

## 2019-08-16 DIAGNOSIS — H1031 Unspecified acute conjunctivitis, right eye: Secondary | ICD-10-CM

## 2019-08-16 MED ORDER — POLYMYXIN B-TRIMETHOPRIM 10000-0.1 UNIT/ML-% OP SOLN: 1.0000 [drp] | OPHTHALMIC | 0 refills | Status: DC

## 2019-08-16 NOTE — Discharge Instructions (Addendum)
You may want to replace your eye makeup or contact lenses that you used while your eye was infected  Wash your hands regularly, especially before and after touching your eye  Follow up as needed

## 2019-08-16 NOTE — ED Triage Notes (Signed)
C/o right eye problem. Reports this happened in April to her left eye.

## 2019-08-16 NOTE — ED Provider Notes (Signed)
[facility]   [id number] 08/16/19 Arrival Time: 7741  CC: EYE REDNESS  SUBJECTIVE:  Megan [name] is a 49 y.o. female who presents with complaint of eye redness that began abruptly 3 days ago. Denies a precipitating event, trauma, or close contacts with similar symptoms. Has not tried OTC eye drops. Symptoms are made worse with light. Reports similar symptoms in the past in the L eye. Denies fever, chills, nausea, vomiting, eye pain, painful eye movements, halos, discharge, itching, vision changes, double vision, FB sensation, periorbital erythema.     Denies contact lens use.    ROS: As per HPI.  All other pertinent ROS negative.     Past Medical History:  Diagnosis Date  . Diabetes mellitus without complication ([place])   . Hyperlipidemia   . Hypertension   . Metabolic syndrome   . Ulcerative colitis ([place])   . Ulcerative colitis, acute [facility])    Past Surgical History:  Procedure Laterality Date  . EYE SURGERY     No Known Allergies No current facility-administered medications on file prior to encounter.   Current Outpatient Medications on File Prior to Encounter  Medication Sig Dispense Refill  . dapagliflozin propanediol (FARXIGA) 10 MG TABS tablet Take 10 mg by mouth daily before breakfast. 30 tablet 3  . Empagliflozin (JARDIANCE PO) Take by mouth.    . enalapril (VASOTEC) 10 MG tablet Take 1 tablet (10 mg total) by mouth daily. 90 tablet 1  . levothyroxine (SYNTHROID) 125 MCG tablet Take 1 tablet by mouth once daily 90 tablet 0  . metFORMIN (GLUCOPHAGE) 500 MG tablet Take 500 mg by mouth 2 (two) times daily.    . Semaglutide,0.25 or 0.5MG/DOS, (OZEMPIC, 0.25 OR 0.5 MG/DOSE,) 2 MG/1.5ML SOPN Inject 0.25 mg into the skin once a week. 0.25 mg x 2 weeks then 0.5 mg weekly 1.5 mL 3  . simvastatin (ZOCOR) 10 MG tablet Take 1 tablet (10 mg total) by mouth at bedtime. 90 tablet 3  . tobramycin (TOBREX) 0.3 % ophthalmic solution Place 2 drops into the left eye  every 4 (four) hours. 5 mL 0  . triamcinolone cream (KENALOG) 0.1 % Apply 1 application topically daily as needed. 30 g 0   Social History   Socioeconomic History  . Marital status: Single    Spouse name: Not on file  . Number of children: 1  . Years of education: Not on file  . Highest education level: Not on file  Occupational History  . Not on file  Tobacco Use  . Smoking status: Never Smoker  . Smokeless tobacco: Never Used  Substance and Sexual Activity  . Alcohol use: Yes    Comment: occasionally   . Drug use: No  . Sexual activity: Not Currently  Other Topics Concern  . Not on file  Social History Narrative  . Not on file   Social Determinants of Health   Financial Resource Strain:   . Difficulty of Paying Living Expenses:   Food Insecurity:   . Worried About Charity fundraiser in the Last Year:   . Arboriculturist in the Last Year:   Transportation Needs:   . Film/video editor (Medical):   Marland Kitchen Lack of Transportation (Non-Medical):   Physical Activity:   . Days of Exercise per Week:   . Minutes of Exercise per Session:   Stress:   . Feeling of Stress :   Social Connections:   . Frequency of Communication with Friends and Family:   .  Frequency of Social Gatherings with Friends and Family:   . Attends Religious Services:   . Active Member of Clubs or Organizations:   . Attends Archivist Meetings:   Marland Kitchen Marital Status:   Intimate Partner Violence:   . Fear of Current or Ex-Partner:   . Emotionally Abused:   Marland Kitchen Physically Abused:   . Sexually Abused:    Family History  Problem Relation Age of Onset  . Asthma Mother   . COPD Mother   . Hypertension Mother     OBJECTIVE:    Visual Acuity  Right Eye Distance: 20 40 Left Eye Distance: 20 20  Bilateral Distance: 20 20  Right Eye Near:   Left Eye Near:    Bilateral Near:      Vitals:   08/16/19 1901  BP: (!) 168/102  Pulse: 99  Resp: 16  Temp: 99 F (37.2 C)  TempSrc: Oral    SpO2: 100%    General appearance: alert; no distress Eyes: Conjunctival erythema to R eye, eye is draining clear and green fluid. PERRL; EOMI without discomfort;  no obvious drainage; lid everted without obvious FB; Neck: supple Lungs: clear to auscultation bilaterally Heart: regular rate and rhythm Skin: warm and dry Psychological: alert and cooperative; normal mood and affect   ASSESSMENT & PLAN:  1. Acute bacterial conjunctivitis of right eye     Meds ordered this encounter  Medications  . trimethoprim-polymyxin b (POLYTRIM) ophthalmic solution    Sig: Place 1 drop into the right eye every 4 (four) hours.    Dispense:  10 mL    Refill:  0    Order Specific Question:   Supervising Provider    Answer:   Chase Picket [[id number]]     Conjunctivitis Use eye drops as prescribed and to completion Dispose of old contacts and wear glasses until you have finished course of antibiotic eye drops Wash pillow cases, wash hands regularly with soap and water, avoid touching your face and eyes, wash door handles, light switches, remotes and other objects you frequently touch Return or follow up with PCP if symptoms persists such as fever, chills, redness, swelling, eye pain, painful eye movements, vision changes  Reviewed expectations re: course of current medical issues. Questions answered. Outlined signs and symptoms indicating need for more acute intervention. Patient verbalized understanding. After Visit Summary given.    Faustino Congress, NP 08/16/19 1927

## 2019-08-16 NOTE — ED Notes (Signed)
Pt on phone call when triage attempted. Advised pt that we will triage when she has completed her phone call.

## 2019-09-15 ENCOUNTER — Other Ambulatory Visit: Payer: Self-pay | Admitting: Family Medicine

## 2019-09-15 DIAGNOSIS — E039 Hypothyroidism, unspecified: Secondary | ICD-10-CM

## 2019-10-26 ENCOUNTER — Ambulatory Visit: Payer: 59 | Admitting: Internal Medicine

## 2019-10-27 ENCOUNTER — Telehealth (INDEPENDENT_AMBULATORY_CARE_PROVIDER_SITE_OTHER): Payer: 59 | Admitting: Family Medicine

## 2019-10-27 DIAGNOSIS — H1032 Unspecified acute conjunctivitis, left eye: Secondary | ICD-10-CM

## 2019-10-27 MED ORDER — ERYTHROMYCIN 5 MG/GM OP OINT: 1.0000 "application " | TOPICAL_OINTMENT | Freq: Every day | OPHTHALMIC | 0 refills | Status: DC

## 2019-10-27 NOTE — Patient Instructions (Signed)
-  I sent the medication(s) we discussed to your pharmacy: Meds ordered this encounter  Medications  . erythromycin ophthalmic ointment    Sig: Place 1 application into the left eye 3 times per day (at minimum at bedtime) for 7 days.    Dispense:  3.5 g    Refill:  0    Please let us know if you have any questions or concerns regarding this prescription.  Call today to schedule an appointment with an opthomologist given your recurrent issues with the eyes.  Follow up with your doctor next week as scheduled.  I hope you are feeling better soon! Seek care promptly if your symptoms worsen, new concerns arise or you are not improving with treatment.

## 2019-10-27 NOTE — Progress Notes (Signed)
Virtual Visit via Video Note  I connected with Megan [name]  on 10/27/19 at 10:20 AM EDT by a video enabled telemedicine application and verified that I am speaking with the correct person using two identifiers.  Location patient: home, [place] Location provider:work or home office Persons participating in the virtual visit: patient, provider  I discussed the limitations of evaluation and management by telemedicine and the availability of in person appointments. The patient expressed understanding and agreed to proceed.   HPI:  Acute visit for eye issues: -started 5 days ago -symptoms include: conjunctival erythema, drainage and irritation, some crusting around the L eye, small amount of white discharge in the corrner -has had pink eye several times this year, diagnosed in the Emergency room/[facility] In march and June  -improved now with some eye drops the last few days she had on hand - but not resolved -no fevers, nausea, HA or vision changes, nasal congestion, drainage -no known sick contacts -had covid treated with antibodies in the past and had covid vaccine -has diabetes  ROS: See pertinent positives and negatives per HPI.  Past Medical History:  Diagnosis Date   Diabetes mellitus without complication ([place])    Hyperlipidemia    Hypertension    Metabolic syndrome    Ulcerative colitis ([place])    Ulcerative colitis, acute ([place])     Past Surgical History:  Procedure Laterality Date   EYE SURGERY      Family History  Problem Relation Age of Onset   Asthma Mother    COPD Mother    Hypertension Mother     SOCIAL HX: see hpi   Current Outpatient Medications:    dapagliflozin propanediol (FARXIGA) 10 MG TABS tablet, Take 10 mg by mouth daily before breakfast., Disp: 30 tablet, Rfl: 3   Empagliflozin (JARDIANCE PO), Take by mouth., Disp: , Rfl:    enalapril (VASOTEC) 10 MG tablet, Take 1 tablet (10 mg total) by mouth daily., Disp: 90 tablet, Rfl: 1   erythromycin  ophthalmic ointment, Place 1 application into the left eye at bedtime., Disp: 3.5 g, Rfl: 0   EUTHYROX 125 MCG tablet, Take 1 tablet by mouth once daily, Disp: 90 tablet, Rfl: 0   metFORMIN (GLUCOPHAGE) 500 MG tablet, Take 500 mg by mouth 2 (two) times daily., Disp: , Rfl:    Semaglutide,0.25 or 0.5MG/DOS, (OZEMPIC, 0.25 OR 0.5 MG/DOSE,) 2 MG/1.5ML SOPN, Inject 0.25 mg into the skin once a week. 0.25 mg x 2 weeks then 0.5 mg weekly, Disp: 1.5 mL, Rfl: 3   simvastatin (ZOCOR) 10 MG tablet, Take 1 tablet (10 mg total) by mouth at bedtime., Disp: 90 tablet, Rfl: 3   tobramycin (TOBREX) 0.3 % ophthalmic solution, Place 2 drops into the left eye every 4 (four) hours., Disp: 5 mL, Rfl: 0   triamcinolone cream (KENALOG) 0.1 %, Apply 1 application topically daily as needed., Disp: 30 g, Rfl: 0   trimethoprim-polymyxin b (POLYTRIM) ophthalmic solution, Place 1 drop into the right eye every 4 (four) hours., Disp: 10 mL, Rfl: 0  EXAM:  VITALS per patient if applicable:  GENERAL: alert, oriented, appears well and in no acute distress  HEENT: atraumatic, conjunttiva mildy erythematous on limited video visit exam, EOMI, do not appreciate periorbital swelling or edema, reports vision as normal, no obvious abnormalities on inspection of external nose and ears  NECK: normal movements of the head and neck  LUNGS: on inspection no signs of respiratory distress, breathing rate appears normal, no obvious gross SOB, gasping or  wheezing  CV: no obvious cyanosis  MS: moves all visible extremities without noticeable abnormality  PSYCH/NEURO: pleasant and cooperative, no obvious depression or anxiety, speech and thought processing grossly intact  ASSESSMENT AND PLAN:  Discussed the following assessment and plan:  Acute conjunctivitis of left eye, unspecified acute conjunctivitis type  -we discussed possible serious and likely etiologies, options for evaluation and workup, limitations of telemedicine  visit vs in person visit, treatment, treatment risks and precautions. Pt prefers to treat via telemedicine empirically rather then risking or undertaking an in person visit at this moment. She opted for empiric acute treatment with erythro optho ointment along with agrees to seek opthalmology appointment - she plans to call to arrange. Advised PCP needed as well and options and reports she has appt coming up soon.  Patient agrees to seek prompt in person care if worsening, new symptoms arise, or if is not improving with treatment.   I discussed the assessment and treatment plan with the patient. The patient was provided an opportunity to ask questions and all were answered. The patient agreed with the plan and demonstrated an understanding of the instructions.   The patient was advised to call back or seek an in-person evaluation if the symptoms worsen or if the condition fails to improve as anticipated.   [name], DO

## 2019-10-31 ENCOUNTER — Other Ambulatory Visit: Payer: Self-pay

## 2019-10-31 ENCOUNTER — Ambulatory Visit (INDEPENDENT_AMBULATORY_CARE_PROVIDER_SITE_OTHER): Payer: 59 | Admitting: Family Medicine

## 2019-10-31 ENCOUNTER — Encounter: Payer: Self-pay | Admitting: Family Medicine

## 2019-10-31 VITALS — BP 120/74 | HR 68 | Temp 97.9°F | Resp 12 | Ht 70.0 in | Wt 176.0 lb

## 2019-10-31 DIAGNOSIS — E039 Hypothyroidism, unspecified: Secondary | ICD-10-CM | POA: Diagnosis not present

## 2019-10-31 DIAGNOSIS — K219 Gastro-esophageal reflux disease without esophagitis: Secondary | ICD-10-CM

## 2019-10-31 DIAGNOSIS — B3731 Acute candidiasis of vulva and vagina: Secondary | ICD-10-CM

## 2019-10-31 DIAGNOSIS — E1169 Type 2 diabetes mellitus with other specified complication: Secondary | ICD-10-CM | POA: Diagnosis not present

## 2019-10-31 DIAGNOSIS — R0789 Other chest pain: Secondary | ICD-10-CM | POA: Diagnosis not present

## 2019-10-31 DIAGNOSIS — K5190 Ulcerative colitis, unspecified, without complications: Secondary | ICD-10-CM | POA: Insufficient documentation

## 2019-10-31 DIAGNOSIS — B373 Candidiasis of vulva and vagina: Secondary | ICD-10-CM

## 2019-10-31 DIAGNOSIS — E785 Hyperlipidemia, unspecified: Secondary | ICD-10-CM

## 2019-10-31 DIAGNOSIS — R0602 Shortness of breath: Secondary | ICD-10-CM

## 2019-10-31 DIAGNOSIS — I10 Essential (primary) hypertension: Secondary | ICD-10-CM | POA: Diagnosis not present

## 2019-10-31 LAB — POCT GLYCOSYLATED HEMOGLOBIN (HGB A1C): Hemoglobin A1C: 12.9 % — AB (ref 4.0–5.6)

## 2019-10-31 MED ORDER — OMEPRAZOLE 20 MG PO CPDR: 20.0000 mg | DELAYED_RELEASE_CAPSULE | Freq: Every day | ORAL | 3 refills | Status: DC

## 2019-10-31 MED ORDER — FLUCONAZOLE 150 MG PO TABS: 150.0000 mg | ORAL_TABLET | ORAL | 0 refills | Status: AC

## 2019-10-31 NOTE — Progress Notes (Signed)
HPI: [name] is a 49 y.o. female, who is here today for chronic disease management.   She was last seen on 10/27/18. She has been back and forth [place].  DM II: She is not checking BS's. She is now on Januvia ? mg and Metformin. Negative for polydipsia,polyuria, or polyphagia. Requesting referral to endocrinologist.  Lab Results  Component Value Date   HGBA1C 10.7 (A) 10/27/2018   Lab Results  Component Value Date   MICROALBUR <0.7 10/27/2018   Her "vison is not good." She has not had eye exam in a while.  Hypothyroidism: Currently she is on Euthyrox 125 mcg daily. Tolerating medication well, no side effects reported. She has not noted changes in bowel habits, tremor, cold/heat intolerance, or abnormal weight loss.  Lab Results  Component Value Date   TSH 0.03 (L) 10/27/2018   HTN:She is on Enalapril 10 mg daily. She is not checking BP at home. Negative for severe/frequent headache,chest pain, dyspnea, palpitation, claudication, focal weakness, or edema.  Numbness in foot, bilateral, this has been going for a couple years and stable. She follows with podiatrist in [place].  Lab Results  Component Value Date   CREATININE 0.66 07/14/2016   BUN 10 07/14/2016   NA 133 (L) 07/14/2016   K 3.9 07/14/2016   CL 100 (L) 07/14/2016   CO2 22 07/14/2016   HLD: Simvastatin was discontinued because side effects, achy legs and cramps.  She is c/o recurrent yeast infections. Last gyn exam 2019. Negative for pelvic pain or abnormal vaginal bleeding. No Hx of STD's, denies risk factors.  For the past 1-2 years she has "little pain in my arteries." CP that can be right or left, usually at rest, when in bed at night. She is not sure about duration because she falls asleep with the pain.  Pain does not interfere with sleep. Pain is not radiated. She cannot describe type of pain, it is mild.  It does not happens with exertion during the day. States that her  former PCP ordered EKG and circulation test periodically, reporting negative for heart abnormalities.  Occasionally she has acid reflux with certain foods: Spaghetti and tomatoes sauce,etc. She gets "out of breath" easy. She feels SOB going from her bedroom to the bathroom. No associated cough or wheezing. This has been going for "long time."  She also would like to see GI, Hx of UC. Last colonoscopy in 2019. Dx'ed in 1997 and problem has been stable. No flare ups in a year.  Review of Systems  Constitutional: Negative for activity change, appetite change, fatigue and fever.  HENT: Negative for mouth sores, nosebleeds, sore throat and trouble swallowing.   Eyes: Negative for photophobia and pain.  Gastrointestinal: Negative for abdominal pain, nausea and vomiting.  Genitourinary: Negative for decreased urine volume, dysuria and hematuria.  Skin: Negative for pallor and rash.  Neurological: Negative for syncope, facial asymmetry and weakness.  Hematological: Negative for adenopathy. Does not bruise/bleed easily.  Psychiatric/Behavioral: Negative for confusion.  Rest of ROS, see pertinent positives sand negatives in HPI  Current Outpatient Medications on File Prior to Visit  Medication Sig Dispense Refill  . enalapril (VASOTEC) 10 MG tablet Take 1 tablet (10 mg total) by mouth daily. 90 tablet 1  . erythromycin ophthalmic ointment Place 1 application into the left eye at bedtime. 3.5 g 0  . EUTHYROX 125 MCG tablet Take 1 tablet by mouth once daily 90 tablet 0  . metFORMIN (GLUCOPHAGE) 500 MG tablet  Take 500 mg by mouth 2 (two) times daily.     No current facility-administered medications on file prior to visit.   Past Medical History:  Diagnosis Date  . Diabetes mellitus without complication ([place])   . Hyperlipidemia   . Hypertension   . Metabolic syndrome   . Ulcerative colitis ([place])   . Ulcerative colitis, acute ([place])    No Known Allergies  Social History   Socioeconomic  History  . Marital status: Single    Spouse name: Not on file  . Number of children: 1  . Years of education: Not on file  . Highest education level: Not on file  Occupational History  . Not on file  Tobacco Use  . Smoking status: Never Smoker  . Smokeless tobacco: Never Used  Vaping Use  . Vaping Use: Never used  Substance and Sexual Activity  . Alcohol use: Yes    Comment: occasionally   . Drug use: No  . Sexual activity: Not Currently  Other Topics Concern  . Not on file  Social History Narrative  . Not on file   Social Determinants of Health   Financial Resource Strain:   . Difficulty of Paying Living Expenses: Not on file  Food Insecurity:   . Worried About Charity fundraiser in the Last Year: Not on file  . Ran Out of Food in the Last Year: Not on file  Transportation Needs:   . Lack of Transportation (Medical): Not on file  . Lack of Transportation (Non-Medical): Not on file  Physical Activity:   . Days of Exercise per Week: Not on file  . Minutes of Exercise per Session: Not on file  Stress:   . Feeling of Stress : Not on file  Social Connections:   . Frequency of Communication with Friends and Family: Not on file  . Frequency of Social Gatherings with Friends and Family: Not on file  . Attends Religious Services: Not on file  . Active Member of Clubs or Organizations: Not on file  . Attends Archivist Meetings: Not on file  . Marital Status: Not on file    Vitals:   10/31/19 0727  BP: 120/74  Pulse: 68  Resp: 12  Temp: 97.9 F (36.6 C)  SpO2: 98%   Body mass index is 25.25 kg/m.  Physical Exam Vitals and nursing note reviewed.  Constitutional:      General: She is not in acute distress.    Appearance: She is well-developed.  HENT:     Head: Normocephalic and atraumatic.  Eyes:     Conjunctiva/sclera: Conjunctivae normal.     Pupils: Pupils are equal, round, and reactive to light.  Cardiovascular:     Rate and Rhythm: Normal  rate and regular rhythm.     Pulses:          Dorsalis pedis pulses are 2+ on the right side and 2+ on the left side.     Heart sounds: No murmur heard.   Pulmonary:     Effort: Pulmonary effort is normal. No respiratory distress.     Breath sounds: Normal breath sounds.  Abdominal:     Palpations: Abdomen is soft. There is no hepatomegaly or mass.     Tenderness: There is no abdominal tenderness.  Genitourinary:    Comments: Deferred to gyn. Lymphadenopathy:     Cervical: No cervical adenopathy.  Skin:    General: Skin is warm.     Findings: No erythema or rash.  Neurological:     General: No focal deficit present.     Mental Status: She is alert and oriented to person, place, and time.     Cranial Nerves: No cranial nerve deficit.     Gait: Gait normal.  Psychiatric:     Comments: Well groomed, good eye contact.    Diabetic Foot Exam - Simple   Simple Foot Form Diabetic Foot exam was performed with the following findings: Yes 10/31/2019  6:03 PM  Visual Inspection No deformities, no ulcerations, no other skin breakdown bilaterally: Yes Sensation Testing Intact to touch and monofilament testing bilaterally: Yes Pulse Check Posterior Tibialis and Dorsalis pulse intact bilaterally: Yes Comments     ASSESSMENT AND PLAN:  [name] was seen today for chronic disease management.   Orders Placed This Encounter  Procedures  . Comprehensive metabolic panel  . Lipid panel  . TSH  . Microalbumin / creatinine urine ratio  . Ambulatory referral to Gastroenterology  . Ambulatory referral to Endocrinology  . Ambulatory referral to Cardiology  . POC HgB A1c  . EKG 12-Lead   Lab Results  Component Value Date   CHOL 238 (H) 10/31/2019   HDL 39 (L) 10/31/2019   LDLCALC 166 (H) 10/31/2019   TRIG 180 (H) 10/31/2019   CHOLHDL 6.1 (H) 10/31/2019   Lab Results  Component Value Date   MICROALBUR 0.8 10/31/2019   MICROALBUR <0.7 10/27/2018   Lab Results    Component Value Date   TSH 1.41 10/31/2019   Lab Results  Component Value Date   ALT 32 (H) 10/31/2019   AST 16 10/31/2019   BILITOT 0.4 10/31/2019    Lab Results  Component Value Date   HGBA1C 12.9 (A) 10/31/2019   Lab Results  Component Value Date   CREATININE 0.62 10/31/2019   BUN 10 10/31/2019   NA 131 (L) 10/31/2019   K 3.9 10/31/2019   CL 95 (L) 10/31/2019   CO2 22 10/31/2019    Type 2 diabetes mellitus with other specified complication, without long-term current use of insulin (HCC) Problem is poorly controlled. We discussed possible complications of elevated glucose. Reported vision changes could be related. Strongly recommend arranging eye exam. She is not sure about Januvia dose, she will let us know. Because recurrent yeast infections, SGLT2 meds are not an options. GLP-1 and basal insulin can be considered, will hold for recommendations until knowing Januvia dose. Continue Metformin 500 mg bid.  Regular exercise and healthy diet with avoidance of added sugar food intake is an important part of treatment and recommended. Annual eye exam, periodic dental and foot care recommended. Continue following with endocrinologist.  Hypertension, essential, benign BP adequately controlled. Continue low salt diet. No changes in current management. Overdue for eye exam.  Hyperlipidemia associated with type 2 diabetes mellitus ([place]) She did not tolerate stain well. We discussed benefits in regard to CVD prevention.  Chest discomfort We discussed possible etiologies. EKG today:SR, normal axis and intervals, ? LAE, otherwise negative. Hx and examination do not suggest a serious process. She has some CV risk, so cardiology referral placed. Instructed about warning signs.  Ulcerative colitis without complications, unspecified location ([place]) In remission. Overdue for colonoscopy. Continue following with GI.  SOB (shortness of breath) ? Deconditioning. No hx of  tobacco use.  Gastroesophageal reflux disease, unspecified whether esophagitis present Problem is not well controlled. She agrees with trying PPI. GERD precautions. Appt with GI will be arranged.  -     omeprazole (  PRILOSEC) 20 MG capsule; Take 1 capsule (20 mg total) by mouth daily before breakfast.  Vulvovaginal candidiasis Empiric treatment started today. Some side effects of medication discussed. Elevated glucose most likely contributing factor. If persistent symptom she will nee an appt with her gyn.  -     fluconazole (DIFLUCAN) 150 MG tablet; Take 1 tablet (150 mg total) by mouth once a week for 2 doses.   Hypothyroidism (acquired) Continue current management. Medication will be adjusted, if needed,according to TSH result.   Spent 49 minutes with pt. During this time hx was obtained and documented. Examination was performed,reviwed prior  lab results. Discussion of Dx's and plan. She had severe concerns she wanted to addressed.   Return in about 6 months (around 05/02/2020) for cpe.   Megan Spackman G. [place], MD  [facility]. [place] office.  A few things to remember from today's visit:  Type 2 diabetes mellitus with other specified complication, without long-term current use of insulin ([place]) - Plan: POC HgB A1c  Hypertension, essential, benign  Hyperthyroidism  Hyperlipidemia associated with type 2 diabetes mellitus ([place])  Chest discomfort - Plan: EKG 12-Lead  Yeast infections most likely caused by elevated blood sugars. Endocrinology referral was placed today for thyroid disease and diabetes. Please let us know about diabetes meds you are taking at this time.  Chest discomfort doe snot seem to be cardiac but because diabetes and your are concerned cardio referral was placed.  If you need refills please call your pharmacy. Do not use My Chart to request refills or for acute issues that need immediate attention.   Please be sure medication list is  accurate. If a new problem present, please set up appointment sooner than planned today.

## 2019-10-31 NOTE — Assessment & Plan Note (Signed)
Continue current management. Medication will be adjusted, if needed,according to TSH result.

## 2019-10-31 NOTE — Patient Instructions (Addendum)
A few things to remember from today's visit:  Type 2 diabetes mellitus with other specified complication, without long-term current use of insulin ([place]) - Plan: POC HgB A1c  Hypertension, essential, benign  Hyperthyroidism  Hyperlipidemia associated with type 2 diabetes mellitus ([place])  Chest discomfort - Plan: EKG 12-Lead  Yeast infections most likely caused by elevated blood sugars. Endocrinology referral was placed today for thyroid disease and diabetes. Please let us know about diabetes meds you are taking at this time.  Chest discomfort doe snot seem to be cardiac but because diabetes and your are concerned cardio referral was placed.  If you need refills please call your pharmacy. Do not use My Chart to request refills or for acute issues that need immediate attention.   Please be sure medication list is accurate. If a new problem present, please set up appointment sooner than planned today.

## 2019-11-01 LAB — COMPREHENSIVE METABOLIC PANEL WITH GFR
AG Ratio: 1.5 (calc) (ref 1.0–2.5)
ALT: 32 U/L — ABNORMAL HIGH (ref 6–29)
AST: 16 U/L (ref 10–35)
Albumin: 4.5 g/dL (ref 3.6–5.1)
Alkaline phosphatase (APISO): 80 U/L (ref 31–125)
BUN: 10 mg/dL (ref 7–25)
CO2: 22 mmol/L (ref 20–32)
Calcium: 9.7 mg/dL (ref 8.6–10.2)
Chloride: 95 mmol/L — ABNORMAL LOW (ref 98–110)
Creat: 0.62 mg/dL (ref 0.50–1.10)
Globulin: 3 g/dL (ref 1.9–3.7)
Glucose, Bld: 321 mg/dL — ABNORMAL HIGH (ref 65–99)
Potassium: 3.9 mmol/L (ref 3.5–5.3)
Sodium: 131 mmol/L — ABNORMAL LOW (ref 135–146)
Total Bilirubin: 0.4 mg/dL (ref 0.2–1.2)
Total Protein: 7.5 g/dL (ref 6.1–8.1)

## 2019-11-01 LAB — LIPID PANEL
Cholesterol: 238 mg/dL — ABNORMAL HIGH
HDL: 39 mg/dL — ABNORMAL LOW
LDL Cholesterol (Calc): 166 mg/dL — ABNORMAL HIGH
Non-HDL Cholesterol (Calc): 199 mg/dL — ABNORMAL HIGH
Total CHOL/HDL Ratio: 6.1 (calc) — ABNORMAL HIGH
Triglycerides: 180 mg/dL — ABNORMAL HIGH

## 2019-11-01 LAB — MICROALBUMIN / CREATININE URINE RATIO
Creatinine, Urine: 88 mg/dL (ref 20–275)
Microalb Creat Ratio: 9 ug/mg{creat}
Microalb, Ur: 0.8 mg/dL

## 2019-11-01 LAB — TSH: TSH: 1.41 m[IU]/L

## 2019-11-04 ENCOUNTER — Other Ambulatory Visit: Payer: Self-pay

## 2019-11-04 DIAGNOSIS — E785 Hyperlipidemia, unspecified: Secondary | ICD-10-CM

## 2019-11-04 DIAGNOSIS — E039 Hypothyroidism, unspecified: Secondary | ICD-10-CM

## 2019-11-04 DIAGNOSIS — E1169 Type 2 diabetes mellitus with other specified complication: Secondary | ICD-10-CM

## 2019-11-04 MED ORDER — ROSUVASTATIN CALCIUM 20 MG PO TABS: 20.0000 mg | ORAL_TABLET | Freq: Every day | ORAL | 3 refills | Status: DC

## 2019-11-04 MED ORDER — LEVOTHYROXINE SODIUM 125 MCG PO TABS: 125.0000 ug | ORAL_TABLET | Freq: Every day | ORAL | 2 refills | Status: DC

## 2019-11-04 MED ORDER — JANUMET XR 50-1000 MG PO TB24: ORAL_TABLET | ORAL | 3 refills | Status: DC

## 2019-11-07 ENCOUNTER — Other Ambulatory Visit: Payer: Self-pay

## 2019-11-07 MED ORDER — JANUMET XR 100-1000 MG PO TB24: 1.0000 | ORAL_TABLET | Freq: Every day | ORAL | 3 refills | Status: DC

## 2019-11-08 ENCOUNTER — Other Ambulatory Visit: Payer: Self-pay | Admitting: Family Medicine

## 2019-11-08 DIAGNOSIS — I10 Essential (primary) hypertension: Secondary | ICD-10-CM

## 2019-11-15 ENCOUNTER — Other Ambulatory Visit: Payer: Self-pay

## 2019-11-15 ENCOUNTER — Ambulatory Visit: Payer: 59 | Admitting: Internal Medicine

## 2019-11-15 ENCOUNTER — Encounter: Payer: Self-pay | Admitting: Internal Medicine

## 2019-11-15 VITALS — BP 117/82 | HR 113 | Ht 70.0 in | Wt 174.2 lb

## 2019-11-15 DIAGNOSIS — E785 Hyperlipidemia, unspecified: Secondary | ICD-10-CM

## 2019-11-15 DIAGNOSIS — E1165 Type 2 diabetes mellitus with hyperglycemia: Secondary | ICD-10-CM | POA: Diagnosis not present

## 2019-11-15 MED ORDER — INSULIN PEN NEEDLE 32G X 4 MM MISC: 1.0000 | 6 refills | Status: DC

## 2019-11-15 MED ORDER — LANTUS SOLOSTAR 100 UNIT/ML ~~LOC~~ SOPN: 15.0000 [IU] | PEN_INJECTOR | Freq: Every day | SUBCUTANEOUS | 11 refills | Status: DC

## 2019-11-15 NOTE — Progress Notes (Signed)
Name: Megan [name]  MRN/ DOB: [id number], Mar 08, 1971   Age/ Sex: 49 y.o., female    PCP: [place], Betty G, MD   Reason for Endocrinology Evaluation: Type 2 Diabetes Mellitus     Date of Initial Endocrinology Visit: 11/15/2019     PATIENT IDENTIFIER: Megan [name] is a 49 y.o. female with a past medical history of T2DM,ulcerative colitis,  Hypothyroidism, HTN  and Dyslipidemia . The patient presented for initial endocrinology clinic visit on 11/15/2019 for consultative assistance with her diabetes management.    HPI: Megan [name] was    Diagnosed with T2DM in her 44's Prior Medications tried/Intolerance: Ozempic - persistent hyperglycemia , januvia, Metformin.  Janumet started 11/14/2019  Currently checking blood sugars occasionally .  Hypoglycemia episodes : no              Hemoglobin A1c has ranged from 10.7% in 2020, peaking at 12.9% in 2021. Patient required assistance for hypoglycemia: no Patient has required hospitalization within the last 1 year from hyper or hypoglycemia: no   In terms of diet, the patient eats 3 meals a day, snacks 1x a day. The pt drinks sugar-sweetened beverages    HOME DIABETES REGIMEN: Janumet [phone] daily    Statin: yes ACE-I/ARB: yes Prior Diabetic Education: no - has a pending referral      METER DOWNLOAD SUMMARY: Date range evaluated: 8/2-8/31/2021 Fingerstick Blood Glucose Tests = 3 Average Number Tests/Day = 0.1 Overall Mean FS Glucose = 292   BG Ranges: Low = 94 High = 502   Hypoglycemic Events/30 Days: BG < 50 = 0 Episodes of symptomatic severe hypoglycemia = 0   DIABETIC COMPLICATIONS: Microvascular complications:    Denies: CKD, retinopathy , neuropathy   Last eye exam: Completed 2020  Macrovascular complications:    Denies: CAD, PVD, CVA   PAST HISTORY: Past Medical History:  Past Medical History:  Diagnosis Date  . Diabetes mellitus without complication ([place])   . Hyperlipidemia     . Hypertension   . Metabolic syndrome   . Ulcerative colitis ([place])   . Ulcerative colitis, acute [facility])    Past Surgical History:  Past Surgical History:  Procedure Laterality Date  . EYE SURGERY        Social History:  reports that she has never smoked. She has never used smokeless tobacco. She reports current alcohol use. She reports that she does not use drugs. Family History:  Family History  Problem Relation Age of Onset  . Asthma Mother   . COPD Mother   . Hypertension Mother      HOME MEDICATIONS: Allergies as of 11/15/2019   No Known Allergies     Medication List       Accurate as of November 15, 2019  2:04 PM. If you have any questions, ask your nurse or doctor.        STOP taking these medications   erythromycin ophthalmic ointment Stopped by: Dorita Sciara, MD   metFORMIN 500 MG tablet Commonly known as: GLUCOPHAGE Stopped by: Dorita Sciara, MD     TAKE these medications   enalapril 10 MG tablet Commonly known as: VASOTEC Take 1 tablet by mouth once daily   Janumet XR [phone] MG Tb24 Generic drug: SitaGLIPtin-MetFORMIN HCl Take 1 tablet by mouth daily.   levothyroxine 125 MCG tablet Commonly known as: Euthyrox Take 1 tablet (125 mcg total) by mouth daily.   omeprazole 20 MG capsule Commonly known as: PRILOSEC Take 1 capsule (20 mg  total) by mouth daily before breakfast. What changed:   when to take this  reasons to take this  additional instructions   rosuvastatin 20 MG tablet Commonly known as: Crestor Take 1 tablet (20 mg total) by mouth daily.        ALLERGIES: No Known Allergies   REVIEW OF SYSTEMS: A comprehensive ROS was conducted with the patient and is negative except as per HPI    OBJECTIVE:   VITAL SIGNS: BP 117/82 (BP Location: Left Arm, Patient Position: Sitting, Cuff Size: Normal)   Pulse (!) 113   Ht 5' 10"  (1.778 m)   Wt 174 lb 3.2 oz (79 kg)   SpO2 97%   BMI 25.00 kg/m    PHYSICAL EXAM:   General: Pt appears well and is in NAD  Neck: General: Supple without adenopathy or carotid bruits. Thyroid: Thyroid size normal.  No goiter or nodules appreciated. No thyroid bruit.  Lungs: Clear with good BS bilat with no rales, rhonchi, or wheezes  Heart: RRR with normal S1 and S2 and no gallops; no murmurs; no rub  Abdomen: Normoactive bowel sounds, soft, nontender, without masses or organomegaly palpable  Extremities:  Lower extremities - No pretibial edema. No lesions.  Skin: Normal texture and temperature to palpation. No rash noted. No Acanthosis nigricans/skin tags. No lipohypertrophy.  Neuro: MS is good with appropriate affect, pt is alert and Ox3   DM foot exam: 11/15/2019  The skin of the feet is intact without sores or ulcerations. The pedal pulses are 2+ on right and 2+ on left. The sensation is intact to a screening 5.07, 10 gram monofilament bilaterally   DATA REVIEWED:  Lab Results  Component Value Date   HGBA1C 12.9 (A) 10/31/2019   HGBA1C 10.7 (A) 10/27/2018   Lab Results  Component Value Date   MICROALBUR 0.8 10/31/2019   LDLCALC 166 (H) 10/31/2019   CREATININE 0.62 10/31/2019   Lab Results  Component Value Date   MICRALBCREAT 9 10/31/2019    Lab Results  Component Value Date   CHOL 238 (H) 10/31/2019   HDL 39 (L) 10/31/2019   LDLCALC 166 (H) 10/31/2019   TRIG 180 (H) 10/31/2019   CHOLHDL 6.1 (H) 10/31/2019        ASSESSMENT / PLAN / RECOMMENDATIONS:   1) Type 2 Diabetes Mellitus, Poorly controlled, Without complications - Most recent A1c of 12.9 %. Goal A1c < 7.0 %.    Plan: GENERAL: I have discussed with the patient the pathophysiology of diabetes. We went over the natural progression of the disease. We talked about both insulin resistance and insulin deficiency. We stressed the importance of lifestyle changes including diet and exercise. I explained the complications associated with diabetes including retinopathy, nephropathy, neuropathy as  well as increased risk of cardiovascular disease. We went over the benefit seen with glycemic control.    I explained to the patient that diabetic patients are at higher than normal risk for amputations. The patient was informed that diabetes is the number one cause of non-traumatic amputations in [place]. The patient was advised to look and examine their feet , wear proper fitting shoes and not to go barefoot.   Pt agreed to starting insulin with an A1c of 10.0%   Discussed risk of hypoglycemia and weight gain with insulin.   MEDICATIONS: - Continue Janumet 100-1036m daily  - Start lantus 15 units daily   EDUCATION / INSTRUCTIONS:  BG monitoring instructions: Patient is instructed to check her blood sugars 2 times  a day, fasting and bedtime   Call [facility] if: BG persistently < 70 . I reviewed the Rule of 15 for the treatment of hypoglycemia in detail with the patient. Literature supplied.   2) Diabetic complications:   Eye: Does not have known diabetic retinopathy.   Neuro/ Feet: Does not have known diabetic peripheral neuropathy.  Renal: Patient does not  have known baseline CKD. She is not  on an ACEI/ARB at present.Check urine albumin/creatinine ratio yearly starting at time of diagnosis.   3) Dyslipidemia: Patient is on rosuvastatin 20 mg daily started 10/2019 . LDL 166 mg/dL . Discussed cardiovascular benefits of statins   F/U in 3 months   Signed electronically by: Mack Guise, MD  [facility]  [address] [place], [place] [address] Phone: [phone] FAX: [phone]   CC: [place], Betty G, [place] [address] Phone: [phone]  Fax: [phone]    Return to Endocrinology clinic as below: Future Appointments  Date Time Provider [place]  12/21/2019 12:40 PM Sueanne Margarita, MD [facility]  05/01/2020 10:00 AM Martinique, Betty  G, MD [id number]

## 2019-11-15 NOTE — Patient Instructions (Addendum)
-   Continue Janumet 100-1041m daily  - Start lantus 15 units daily    - Contact me if your sugars are consistently less then 80 mg/dL    HOW TO TREAT LOW BLOOD SUGARS (Blood sugar LESS THAN 70 MG/DL)  Please follow the RULE OF 15 for the treatment of hypoglycemia treatment (when your (blood sugars are less than 70 mg/dL)    STEP 1: Take 15 grams of carbohydrates when your blood sugar is low, which includes:   3-4 GLUCOSE TABS  OR  3-4 OZ OF JUICE OR REGULAR SODA OR  ONE TUBE OF GLUCOSE GEL     STEP 2: RECHECK blood sugar in 15 MINUTES STEP 3: If your blood sugar is still low at the 15 minute recheck --> then, go back to STEP 1 and treat AGAIN with another 15 grams of carbohydrates.

## 2019-11-16 DIAGNOSIS — E1165 Type 2 diabetes mellitus with hyperglycemia: Secondary | ICD-10-CM | POA: Insufficient documentation

## 2019-11-16 DIAGNOSIS — E785 Hyperlipidemia, unspecified: Secondary | ICD-10-CM | POA: Insufficient documentation

## 2019-11-18 ENCOUNTER — Telehealth: Payer: Self-pay | Admitting: Internal Medicine

## 2019-11-18 NOTE — Telephone Encounter (Signed)
Patient called wanting to let Dr Kelton Pillar know that she thinks the rosuvastatin is giving her side affects of leg cramps - please advise. Ph# [phone]

## 2019-11-22 ENCOUNTER — Other Ambulatory Visit: Payer: Self-pay

## 2019-11-22 ENCOUNTER — Other Ambulatory Visit: Payer: Self-pay | Admitting: Internal Medicine

## 2019-11-22 MED ORDER — LANTUS SOLOSTAR 100 UNIT/ML ~~LOC~~ SOPN: 15.0000 [IU] | PEN_INJECTOR | Freq: Every day | SUBCUTANEOUS | 0 refills | Status: DC

## 2019-11-22 NOTE — Telephone Encounter (Signed)
Attempted to contact pt. Someone did answer, but no voice could be heard on the other line, and then line was disconnected. Will attempt to contact pt again at a later time.

## 2019-11-22 NOTE — Telephone Encounter (Signed)
Pt returned phone call and stated that the last several nights, she has not experienced any cramping so she will monitor her cramping for the next week or so. If she has any further issues, she will contact this office again.  She also went back to [place] for a week and she forgot her insulin in [place]. Pt requested a refill be sent to [facility] at [address], [place], [place] [phone]). Pt was advised that this will be done, but we cannot know if her insurance will pay for it since she just filled it last week in [place]. Pt verbalized understanding and requested that the attempt still be made. This has been done.

## 2019-11-23 ENCOUNTER — Other Ambulatory Visit: Payer: Self-pay

## 2019-11-23 MED ORDER — INSULIN PEN NEEDLE 32G X 4 MM MISC: 1.0000 | 6 refills | Status: DC

## 2019-12-08 ENCOUNTER — Ambulatory Visit: Payer: 59 | Admitting: Internal Medicine

## 2019-12-15 ENCOUNTER — Other Ambulatory Visit: Payer: Self-pay | Admitting: Family Medicine

## 2019-12-15 DIAGNOSIS — Z1231 Encounter for screening mammogram for malignant neoplasm of breast: Secondary | ICD-10-CM

## 2019-12-16 ENCOUNTER — Encounter: Payer: 59 | Attending: Family Medicine | Admitting: Registered"

## 2019-12-16 ENCOUNTER — Other Ambulatory Visit: Payer: Self-pay

## 2019-12-16 ENCOUNTER — Other Ambulatory Visit: Payer: Self-pay | Admitting: Internal Medicine

## 2019-12-16 DIAGNOSIS — E1165 Type 2 diabetes mellitus with hyperglycemia: Secondary | ICD-10-CM | POA: Diagnosis present

## 2019-12-16 MED ORDER — DEXCOM G6 RECEIVER DEVI: 1.0000 | 0 refills | Status: DC

## 2019-12-16 MED ORDER — DEXCOM G6 TRANSMITTER MISC: 1.0000 | 3 refills | Status: DC

## 2019-12-16 MED ORDER — DEXCOM G6 SENSOR MISC: 1.0000 | 11 refills | Status: DC

## 2019-12-16 NOTE — Progress Notes (Unsigned)
[place]

## 2019-12-16 NOTE — Patient Instructions (Addendum)
Plan:  Aim for 2-3 Carb Choices per meal (~40 grams) +/- 1 either way  Aim for 0-1 Carbs per snack if hungry  Include protein with your meals and snacks Consider reading food labels for Total Carbohydrate of foods Consider increasing your activity level to 3 times per week. (enroll in research project)  I will contact your doctor about getting a CGM prescription so it will be easier to watch your blood sugar.  Continue taking medication as directed by MD

## 2019-12-16 NOTE — Progress Notes (Signed)
Diabetes Self-Management Education  Visit Type: First/Initial  Appt. Start Time: 1000 Appt. End Time: 1115  12/16/2019  [name], identified by name and date of birth, is a 49 y.o. female with a diagnosis of Diabetes: Type 2.   ASSESSMENT  There were no vitals taken for this visit. There is no height or weight on file to calculate BMI.  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 Pt states she doesn't want to go to gyms yet. Pt states she is interested in virtual exercise program.  Patient states she has metabolic syndrome due to high cholesterol, BP pressure and diabetes. 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   Pt states she feels like she eats pretty well, vegetables 5-6x/week. Pt reports she likes soda and juice but at last endocrinologist appt she made a goal of getting off insulin is motivated to stop drinking sugar-sweetened beverages.  Patient is interested in dialing down into the specifics of her diet to also help and will use a CGM to help her decides which foods are better to eat for her blood sugar. Rx was sent to her pharmacy today by Dr. Kelton Pillar. Antonieta Iba, RD, CDCES, will fit patient into her schedule to train how to use it.  Patient states she will look to see what type of Cream of Wheat she has, continue to drink no-sugar added beverages, and reduce intake of carbohydrates.   Diabetes Self-Management Education - 12/16/19 0954      Visit Information   Visit Type First/Initial      Initial Visit   Diabetes Type Type 2    Are you currently following a meal plan? No    Are you taking your medications as prescribed? Yes   Basaglar, Janumet   Date Diagnosed 18 yrs ago      Health Coping   How would you rate your overall health? Fair      Psychosocial Assessment   Patient Belief/Attitude  about Diabetes Motivated to manage diabetes    How often do you need to have someone help you when you read instructions, pamphlets, or other written materials from your doctor or pharmacy? 1 - Never    What is the last grade level you completed in school? masters      Complications   Last HgB A1C per patient/outside source 12.9 %    How often do you check your blood sugar? 3-4 times / week    Fasting Blood glucose range (mg/dL) --   283 today   Have you had a dilated eye exam in the past 12 months? No    Have you had a dental exam in the past 12 months? Yes    Are you checking your feet? Yes    How many days per week are you checking your feet? 7      Dietary Intake   Breakfast oatmeal OR cream of wheat OR eggs, bacon OR grits OR cheese toast    Snack (morning) none    Lunch sandwiches on arnolds multigrain bread OR chicken wings OR oodles of noodles OR canned pasta    Snack (afternoon) none    Dinner [place] food: black beans, mangos, lettuce, ~1c rice, avocado, sour cream, cheese, plant based meat    Beverage(s) starbucks vanilla skinny latte, a lot of  water, zero gatorade, or flavored waters      Exercise   Exercise Type Light (walking / raking leaves)    How many days per week to you exercise? 2    How many minutes per day do you exercise? 45    Total minutes per week of exercise 90      Patient Education   Previous Diabetes Education No    Disease state  Definition of diabetes, type 1 and 2, and the diagnosis of diabetes    Nutrition management  Role of diet in the treatment of diabetes and the relationship between the three main macronutrients and blood glucose level    Physical activity and exercise  Role of exercise on diabetes management, blood pressure control and cardiac health.    Monitoring Identified appropriate SMBG and/or A1C goals.      Individualized Goals (developed by patient)   Nutrition General guidelines for healthy choices and portions discussed     Physical Activity Exercise 3-5 times per week    Monitoring  test my blood glucose as discussed      Outcomes   Expected Outcomes Demonstrated interest in learning. Expect positive outcomes    Future DMSE 4-6 wks    Program Status Not Completed           Individualized Plan for Diabetes Self-Management Training:   Learning Objective:  Patient will have a greater understanding of diabetes self-management. Patient education plan is to attend individual and/or group sessions per assessed needs and concerns.    Patient Instructions  Plan:  Aim for 2-3 Carb Choices per meal (~40 grams) +/- 1 either way  Aim for 0-1 Carbs per snack if hungry  Include protein with your meals and snacks Consider reading food labels for Total Carbohydrate of foods Consider increasing your activity level to 3 times per week. (enroll in research project)  I will contact your doctor about getting a CGM prescription so it will be easier to watch your blood sugar.  Continue taking medication as directed by MD   Expected Outcomes:  Demonstrated interest in learning. Expect positive outcomes  Education material provided: A1C conversion sheet  If problems or questions, patient to contact team via:  Phone, Email and MyChart  Future DSME appointment: 4-6 wks

## 2019-12-21 ENCOUNTER — Ambulatory Visit: Payer: 59 | Admitting: Cardiology

## 2019-12-27 ENCOUNTER — Telehealth: Payer: Self-pay | Admitting: Internal Medicine

## 2019-12-27 NOTE — Telephone Encounter (Signed)
Patient is requesting a call back in regards to paperwork she brought to office that needs to be sent back to insurance.    Call Back # [phone]

## 2020-01-02 ENCOUNTER — Ambulatory Visit (INDEPENDENT_AMBULATORY_CARE_PROVIDER_SITE_OTHER): Payer: 59 | Admitting: Cardiology

## 2020-01-02 ENCOUNTER — Other Ambulatory Visit: Payer: Self-pay

## 2020-01-02 ENCOUNTER — Encounter: Payer: Self-pay | Admitting: Cardiology

## 2020-01-02 VITALS — BP 112/70 | HR 93 | Ht 70.0 in | Wt 178.0 lb

## 2020-01-02 DIAGNOSIS — R0609 Other forms of dyspnea: Secondary | ICD-10-CM

## 2020-01-02 DIAGNOSIS — R072 Precordial pain: Secondary | ICD-10-CM | POA: Diagnosis not present

## 2020-01-02 DIAGNOSIS — I10 Essential (primary) hypertension: Secondary | ICD-10-CM | POA: Diagnosis not present

## 2020-01-02 DIAGNOSIS — R079 Chest pain, unspecified: Secondary | ICD-10-CM

## 2020-01-02 DIAGNOSIS — R0600 Dyspnea, unspecified: Secondary | ICD-10-CM

## 2020-01-02 NOTE — Patient Instructions (Signed)
Medication Instructions:  Your physician recommends that you continue on your current medications as directed. Please refer to the Current Medication list given to you today.  *If you need a refill on your cardiac medications before your next appointment, please call your pharmacy*   Testing/Procedures: Your physician has requested that you have an echocardiogram. Echocardiography is a painless test that uses sound waves to create images of your heart. It provides your doctor with information about the size and shape of your heart and how well your heart's chambers and valves are working. This procedure takes approximately one hour. There are no restrictions for this procedure.  Your physician has requested that you have an exercise tolerance test. For further information please visit [email or web address]. Please also follow instruction sheet, as given.  Your physician has requested that you have a calcium score CT scan.   Follow-Up: At [facility] you and your health needs are our priority.  As part of our continuing mission to provide you with exceptional heart care, we have created designated Provider Care Teams.  These Care Teams include your primary Cardiologist (physician) and Advanced Practice Providers (APPs -  Physician Assistants and Nurse Practitioners) who all work together to provide you with the care you need, when you need it.  Follow up with Dr .Radford Pax as needed based on results of testing.

## 2020-01-02 NOTE — Addendum Note (Signed)
Addended by: Antonieta Iba on: 01/02/2020 01:40 PM   Modules accepted: Orders

## 2020-01-02 NOTE — Progress Notes (Signed)
Cardiology Office Note    Date:  01/02/2020   ID:  Maurie Boettcher, DOB 1970/09/26, MRN [id number]  PCP:  Martinique, Betty G, MD  Cardiologist:  Fransico Him, MD   Chief Complaint  Patient presents with  . New Patient (Initial Visit)    Chest pain and SOB    History of Present Illness:  Megan [name] is a 49 y.o. female who is being seen today for the evaluation of SOB and chest pain at the request of Martinique, Betty G, MD.  This is a 49yo AAF with a hx of DM, HTN, HLD and UC.  She recently was seen by her PCP and complained of SOB and chest pain.  She has noticed the pain in her chest intermittently for the past few years.  It is right and left sided and nonexertional when in bed at night.  She usually falls to sleep and it goes away.  There is no radiation into her arms or legs.  She does have a hx of GERD with spicy foods.  She also has noticed DOE when walking through her house that has occurred for years.  She has never smoked and has no family hx of CAD.  She tells me that her PCP started her on Prilosec and she has not had any symptoms in the past few weeks.  She says in years past, if she would have a cocktail then her left arm would start to get numb.  She will feel nauseated at the time but no diaphoresis. EKG in PCP office normal.    Past Medical History:  Diagnosis Date  . Diabetes mellitus without complication ([place])   . Hyperlipidemia   . Hypertension   . Metabolic syndrome   . Ulcerative colitis ([place])   . Ulcerative colitis, acute [facility])     Past Surgical History:  Procedure Laterality Date  . EYE SURGERY      Current Medications: Current Meds  Medication Sig  . Continuous Blood Gluc Receiver (DEXCOM G6 RECEIVER) DEVI 1 Device by Does not apply route as directed.  . Continuous Blood Gluc Sensor (DEXCOM G6 SENSOR) MISC 1 Device by Does not apply route as directed.  . Continuous Blood Gluc Transmit (DEXCOM G6 TRANSMITTER) MISC 1 Device by Does not  apply route as directed.  . enalapril (VASOTEC) 10 MG tablet Take 1 tablet by mouth once daily  . Insulin Glargine (BASAGLAR KWIKPEN) 100 UNIT/ML Inject 0.15 mLs (15 Units total) into the skin daily.  . Insulin Pen Needle 32G X 4 MM MISC 1 Device by Does not apply route as directed.  Marland Kitchen levothyroxine (EUTHYROX) 125 MCG tablet Take 1 tablet (125 mcg total) by mouth daily.  Marland Kitchen omeprazole (PRILOSEC) 20 MG capsule Take 1 capsule (20 mg total) by mouth daily before breakfast.  . rosuvastatin (CRESTOR) 20 MG tablet Take 1 tablet (20 mg total) by mouth daily.  . SitaGLIPtin-MetFORMIN HCl (JANUMET XR) [phone] MG TB24 Take 1 tablet by mouth daily.    Allergies:   Patient has no known allergies.   Social History   Socioeconomic History  . Marital status: Single    Spouse name: Not on file  . Number of children: 1  . Years of education: Not on file  . Highest education level: Not on file  Occupational History  . Not on file  Tobacco Use  . Smoking status: Never Smoker  . Smokeless tobacco: Never Used  Vaping Use  . Vaping Use: Never used  Substance and Sexual Activity  . Alcohol use: Yes    Comment: occasionally   . Drug use: No  . Sexual activity: Not Currently  Other Topics Concern  . Not on file  Social History Narrative  . Not on file   Social Determinants of Health   Financial Resource Strain:   . Difficulty of Paying Living Expenses: Not on file  Food Insecurity:   . Worried About Charity fundraiser in the Last Year: Not on file  . Ran Out of Food in the Last Year: Not on file  Transportation Needs:   . Lack of Transportation (Medical): Not on file  . Lack of Transportation (Non-Medical): Not on file  Physical Activity:   . Days of Exercise per Week: Not on file  . Minutes of Exercise per Session: Not on file  Stress:   . Feeling of Stress : Not on file  Social Connections:   . Frequency of Communication with Friends and Family: Not on file  . Frequency of Social  Gatherings with Friends and Family: Not on file  . Attends Religious Services: Not on file  . Active Member of Clubs or Organizations: Not on file  . Attends Archivist Meetings: Not on file  . Marital Status: Not on file     Family History:  The patient's family history includes Asthma in her mother; COPD in her mother; Hypertension in her mother.   ROS:   Please see the history of present illness.    ROS All other systems reviewed and are negative.  No flowsheet data found.     PHYSICAL EXAM:   VS:  BP 112/70   Pulse 93   Ht 5' 10"  (1.778 m)   Wt 178 lb (80.7 kg)   SpO2 96%   BMI 25.54 kg/m    GEN: Well nourished, well developed, in no acute distress  HEENT: normal  Neck: no JVD, carotid bruits, or masses Cardiac: RRR; no murmurs, rubs, or gallops,no edema.  Intact distal pulses bilaterally.  Respiratory:  clear to auscultation bilaterally, normal work of breathing GI: soft, nontender, nondistended, + BS MS: no deformity or atrophy  Skin: warm and dry, no rash Neuro:  Alert and Oriented x 3, Strength and sensation are intact Psych: euthymic mood, full affect  Wt Readings from Last 3 Encounters:  01/02/20 178 lb (80.7 kg)  11/15/19 174 lb 3.2 oz (79 kg)  10/31/19 176 lb (79.8 kg)      Studies/Labs Reviewed:   EKG:  EKG was not ordered today  Recent Labs: 10/31/2019: ALT 32; BUN 10; Creat 0.62; Potassium 3.9; Sodium 131; TSH 1.41   Lipid Panel    Component Value Date/Time   CHOL 238 (H) 10/31/2019 0824   TRIG 180 (H) 10/31/2019 0824   HDL 39 (L) 10/31/2019 0824   CHOLHDL 6.1 (H) 10/31/2019 0824   LDLCALC 166 (H) 10/31/2019 1610    Additional studies/ records that were reviewed today include:  OV notes from PCP    ASSESSMENT:    1. Chest pain of uncertain etiology   2. DOE (dyspnea on exertion)   3. Hypertension, essential, benign      PLAN:  In order of problems listed above:  1. Chest pain -CP is atypical -it is nonexertional  with no associated symptoms -EKG is nonischemic -she is diabetic so has CRFs -I will get an ETT to rule out ischemia and coronary Ca score to assess for CAD  2.  SOB -unclear  etiology -could be deconditioning -EKG is nonischemic -I will get a 2D echo to assess LVF and diastolic function  3.  HTN -BP controlled on exam -continue Enalapril 56m daily    Medication Adjustments/Labs and Tests Ordered: Current medicines are reviewed at length with the patient today.  Concerns regarding medicines are outlined above.  Medication changes, Labs and Tests ordered today are listed in the Patient Instructions below.  There are no Patient Instructions on file for this visit.   Signed, TFransico Him MD  01/02/2020 1:26 PM    [facility] [address] [place] [place]  [address]Phone: [phone] Fax: [phone]

## 2020-01-05 ENCOUNTER — Other Ambulatory Visit: Payer: Self-pay

## 2020-01-05 ENCOUNTER — Ambulatory Visit
Admission: RE | Admit: 2020-01-05 | Discharge: 2020-01-05 | Disposition: A | Discharge status: Home / Self Care | Payer: 59 | Source: Ambulatory Visit | Attending: Family Medicine | Admitting: Family Medicine

## 2020-01-05 ENCOUNTER — Ambulatory Visit: Payer: 59

## 2020-01-05 DIAGNOSIS — Z1231 Encounter for screening mammogram for malignant neoplasm of breast: Secondary | ICD-10-CM

## 2020-01-09 ENCOUNTER — Encounter: Payer: 59 | Admitting: Registered"

## 2020-01-16 ENCOUNTER — Encounter: Payer: 59 | Attending: Family Medicine | Admitting: Registered"

## 2020-01-16 ENCOUNTER — Other Ambulatory Visit: Payer: Self-pay

## 2020-01-16 DIAGNOSIS — E1165 Type 2 diabetes mellitus with hyperglycemia: Secondary | ICD-10-CM | POA: Insufficient documentation

## 2020-01-18 ENCOUNTER — Encounter: Payer: Self-pay | Admitting: Registered"

## 2020-01-18 NOTE — Progress Notes (Signed)
CGM Instruction  Patient was seen on 01/16/20 for CGM start and instruction.   Patient received Dexcom G6 system Lot #[id number]; Exp 08-09-2020                                        Patient states she picked up supplies from pharmacy and was told it was self explanatory. Patient was having trouble getting the receiver to connect to the transmitter and after calling Dexcom IT was told she probably needs a new receiver. Pt states [employer] is sending her another set of supplies. Pt states she did not know that her phone would work as Doctor, hospital and will be asking Dex[employer] for a refund for the receiver.  Patient was using the tutorial step by step on her phone. Pt went through the steps again with the 3rd sensor while in the office. RD discovered that the reason she was not able to get a signal to connect from transmitter is because she wasn't pushing the button to insert the sensor into her skin. Pt was provided a starter Dexcom sensor and transmitter and successfully made connection and the warm up period started before she left. Patient was reminded of the lag time of readings and what steps to take when making hypoglycemic treatment decisions.  RD sent patient an invitation via email to share data via [facility] portal.

## 2020-01-20 ENCOUNTER — Other Ambulatory Visit (HOSPITAL_COMMUNITY)
Admission: RE | Admit: 2020-01-20 | Discharge: 2020-01-20 | Disposition: A | Discharge status: Home / Self Care | Payer: 59 | Source: Ambulatory Visit | Attending: Cardiology | Admitting: Cardiology

## 2020-01-20 DIAGNOSIS — Z20822 Contact with and (suspected) exposure to covid-19: Secondary | ICD-10-CM | POA: Insufficient documentation

## 2020-01-20 DIAGNOSIS — Z01812 Encounter for preprocedural laboratory examination: Secondary | ICD-10-CM | POA: Diagnosis present

## 2020-01-20 LAB — SARS CORONAVIRUS 2 (TAT 6-24 HRS): SARS Coronavirus 2: NEGATIVE

## 2020-01-24 ENCOUNTER — Other Ambulatory Visit: Payer: Self-pay

## 2020-01-24 ENCOUNTER — Encounter (HOSPITAL_COMMUNITY): Payer: 59

## 2020-01-24 ENCOUNTER — Ambulatory Visit
Admission: RE | Admit: 2020-01-24 | Discharge: 2020-01-24 | Disposition: A | Discharge status: Home / Self Care | Payer: Self-pay | Source: Ambulatory Visit | Attending: Cardiology | Admitting: Cardiology

## 2020-01-24 ENCOUNTER — Ambulatory Visit (HOSPITAL_COMMUNITY): Payer: 59 | Attending: Cardiology

## 2020-01-24 ENCOUNTER — Ambulatory Visit (INDEPENDENT_AMBULATORY_CARE_PROVIDER_SITE_OTHER): Payer: 59

## 2020-01-24 DIAGNOSIS — R079 Chest pain, unspecified: Secondary | ICD-10-CM | POA: Insufficient documentation

## 2020-01-24 DIAGNOSIS — R0600 Dyspnea, unspecified: Secondary | ICD-10-CM

## 2020-01-24 DIAGNOSIS — R0609 Other forms of dyspnea: Secondary | ICD-10-CM

## 2020-01-24 LAB — EXERCISE TOLERANCE TEST
Estimated workload: 10.4 METS
Exercise duration (min): 7 min
Exercise duration (sec): 31 s
MPHR: 171 {beats}/min
Peak HR: 169 {beats}/min
Percent HR: 98 %
RPE: 17
Rest HR: 92 {beats}/min

## 2020-01-24 LAB — ECHOCARDIOGRAM COMPLETE
Area-P 1/2: 3.65 cm2
S' Lateral: 3 cm

## 2020-01-27 ENCOUNTER — Other Ambulatory Visit: Payer: Self-pay

## 2020-01-27 ENCOUNTER — Telehealth: Payer: Self-pay

## 2020-01-27 ENCOUNTER — Encounter: Payer: Self-pay | Admitting: Internal Medicine

## 2020-01-27 ENCOUNTER — Ambulatory Visit (INDEPENDENT_AMBULATORY_CARE_PROVIDER_SITE_OTHER): Payer: 59 | Admitting: Internal Medicine

## 2020-01-27 VITALS — BP 120/72 | HR 88 | Ht 70.0 in | Wt 178.5 lb

## 2020-01-27 DIAGNOSIS — E1165 Type 2 diabetes mellitus with hyperglycemia: Secondary | ICD-10-CM

## 2020-01-27 DIAGNOSIS — E1169 Type 2 diabetes mellitus with other specified complication: Secondary | ICD-10-CM

## 2020-01-27 DIAGNOSIS — R079 Chest pain, unspecified: Secondary | ICD-10-CM

## 2020-01-27 DIAGNOSIS — R0609 Other forms of dyspnea: Secondary | ICD-10-CM

## 2020-01-27 LAB — POCT GLYCOSYLATED HEMOGLOBIN (HGB A1C): Hemoglobin A1C: 11.8 % — AB (ref 4.0–5.6)

## 2020-01-27 MED ORDER — METOPROLOL TARTRATE 100 MG PO TABS: ORAL_TABLET | ORAL | 0 refills | Status: DC

## 2020-01-27 MED ORDER — JANUMET 50-1000 MG PO TABS: 1.0000 | ORAL_TABLET | Freq: Two times a day (BID) | ORAL | 1 refills | Status: DC

## 2020-01-27 MED ORDER — BASAGLAR KWIKPEN 100 UNIT/ML ~~LOC~~ SOPN: 20.0000 [IU] | PEN_INJECTOR | Freq: Every day | SUBCUTANEOUS | 6 refills | Status: DC

## 2020-01-27 NOTE — Telephone Encounter (Signed)
The patient has been notified of the result and verbalized understanding.  All questions (if any) were answered. Antonieta Iba, RN 01/27/2020 4:05 PM  Coronary CTA has been ordered.

## 2020-01-27 NOTE — Telephone Encounter (Signed)
-----   Message from Sueanne Margarita, MD sent at 01/26/2020  6:41 PM EST ----- Stress test with mild ST depression but in setting of hypertensive BP response.  Please get a coronary CTA to rule out non calcified plaque

## 2020-01-27 NOTE — Progress Notes (Signed)
Name: Megan [name]  Age/ Sex: 49 y.o., female   MRN/ DOB: [id number], 04/02/1970     PCP: Martinique, Betty G, MD   Reason for Endocrinology Evaluation: Type 2 Diabetes Mellitus  Initial Endocrine Consultative Visit: 11/15/2019    PATIENT IDENTIFIER: Megan [name] is a 49 y.o. female with a past medical history of T2Dm and UC. The patient has followed with Endocrinology clinic since 11/15/2019 for consultative assistance with management of her diabetes.  DIABETIC HISTORY:  Megan [name] was diagnosed with DM in her 28's.Ozempic - persistent hyperglycemia . Janumet started 10/2019 . Her hemoglobin A1c has ranged from 10.7% in 2020, peaking at 12.9% in 2021  On her initial visit to our clinic she had an A1c of 12.9% . She had just been started on Janumet . We started basal insulin  SUBJECTIVE:   During the last visit (11/15/2019): A1c 12.9%. Continue Janumet and started insulin   Today (01/27/2020): Megan [name]  She checks her blood sugars multiple times daily through CGM.  The patient has not had hypoglycemic episodes since the last clinic visit.   HOME DIABETES REGIMEN:  Janumet [id number] mg Basaglar  units daily   Statin: yes ACE-I/ARB: yes     CONTINUOUS GLUCOSE MONITORING RECORD INTERPRETATION    Dates of Recording: 10/30-11/02/2020  Sensor description:Dexcom  Results statistics:   CGM use % of time 79  Average and SD 313/48  Time in range     0   %  % Time Above 180 9  % Time above 250 91  % Time Below target 0     Glycemic patterns summary: hyperglycemia all day and night   Hyperglycemic episodes  All day and night   Hypoglycemic episodes occurred N/A  Overnight periods: High   Preprandial periods: High      DIABETIC COMPLICATIONS: Microvascular complications:    Denies: CKD, retinopathy , neuropathy   Last Eye Exam: Completed   Macrovascular complications:    Denies: CAD, CVA, PVD   HISTORY:  Past Medical History:   Past Medical History:  Diagnosis Date  . Diabetes mellitus without complication ([place])   . Hyperlipidemia   . Hypertension   . Metabolic syndrome   . Ulcerative colitis ([place])   . Ulcerative colitis, acute [facility])     Past Surgical History:  Past Surgical History:  Procedure Laterality Date  . EYE SURGERY       Social History:  reports that she has never smoked. She has never used smokeless tobacco. She reports current alcohol use. She reports that she does not use drugs. Family History:  Family History  Problem Relation Age of Onset  . Asthma Mother   . COPD Mother   . Hypertension Mother       HOME MEDICATIONS: Allergies as of 01/27/2020   No Known Allergies     Medication List       Accurate as of January 27, 2020 12:05 PM. If you have any questions, ask your nurse or doctor.        Basaglar KwikPen 100 UNIT/ML Inject 0.15 mLs (15 Units total) into the skin daily.   Dexcom G6 Receiver Devi 1 Device by Does not apply route as directed.   Dexcom G6 Sensor Misc 1 Device by Does not apply route as directed.   Dexcom G6 Transmitter Misc 1 Device by Does not apply route as directed.   enalapril 10 MG tablet Commonly known as: VASOTEC Take 1 tablet by mouth once daily  Insulin Pen Needle 32G X 4 MM Misc 1 Device by Does not apply route as directed.   Janumet XR [phone] MG Tb24 Generic drug: SitaGLIPtin-MetFORMIN HCl Take 1 tablet by mouth daily.   levothyroxine 125 MCG tablet Commonly known as: Euthyrox Take 1 tablet (125 mcg total) by mouth daily.   omeprazole 20 MG capsule Commonly known as: PRILOSEC Take 1 capsule (20 mg total) by mouth daily before breakfast.   rosuvastatin 20 MG tablet Commonly known as: Crestor Take 1 tablet (20 mg total) by mouth daily.        OBJECTIVE:   Vital Signs: BP 120/72   Pulse 88   Ht 5' 10"  (1.778 m)   Wt 178 lb 8 oz (81 kg)   SpO2 98%   BMI 25.61 kg/m    Wt Readings from Last 3 Encounters:   01/02/20 178 lb (80.7 kg)  11/15/19 174 lb 3.2 oz (79 kg)  10/31/19 176 lb (79.8 kg)     Exam: General: Pt appears well and is in NAD  Lungs: Clear with good BS bilat with no rales, rhonchi, or wheezes  Heart: RRR with normal S1 and S2 and no gallops; no murmurs; no rub  Abdomen: Normoactive bowel sounds, soft, nontender, without masses or organomegaly palpable  Extremities: No pretibial edema.  Neuro: MS is good with appropriate affect, pt is alert and Ox3     DM foot exam: 11/15/2019  The skin of the feet is intact without sores or ulcerations. The pedal pulses are 2+ on right and 2+ on left. The sensation is intact to a screening 5.07, 10 gram monofilament bilaterally   DATA REVIEWED:  Lab Results  Component Value Date   HGBA1C 12.9 (A) 10/31/2019   HGBA1C 10.7 (A) 10/27/2018   Lab Results  Component Value Date   MICROALBUR 0.8 10/31/2019   LDLCALC 166 (H) 10/31/2019   CREATININE 0.62 10/31/2019   Lab Results  Component Value Date   MICRALBCREAT 9 10/31/2019     Lab Results  Component Value Date   CHOL 238 (H) 10/31/2019   HDL 39 (L) 10/31/2019   LDLCALC 166 (H) 10/31/2019   TRIG 180 (H) 10/31/2019   CHOLHDL 6.1 (H) 10/31/2019         ASSESSMENT / PLAN / RECOMMENDATIONS:   1) Type 2 Diabetes Mellitus, Poorly controlled, With out complications - Most recent A1c of 11.8 %. Goal A1c < 7.0 %.     - A1c down by 1% but still continues with hyperglycemia. She has made lifestyle changes which  I have encouraged her to continue with this.  - I have discussed adding a prandial insulin vs adjusting Janumet and increasing basal insulin - AT this time we have opted to do the latter and she understands , if this is not going to work, will add prandial insulin with each meal , if hyperglycemia persists in the next 2 weeks.    MEDICATIONS: - Change Janumet 50-1079m to be taken BID - Increase lantus to 20 units daily   EDUCATION / INSTRUCTIONS:  BG monitoring  instructions: Patient is instructed to check her blood sugars 3 times a day, before meals   Call [facility] if: BG persistently < 70  . I reviewed the Rule of 15 for the treatment of hypoglycemia in detail with the patient. Literature supplied.    2) Diabetic complications:   Eye: Does not have known diabetic retinopathy.   Neuro/ Feet: Does not have known diabetic peripheral neuropathy .  Renal: Patient does not have known baseline CKD. She   is not on an ACEI/ARB at present.       F/U in 3 months    Signed electronically by: Mack Guise, MD  [facility] End[facility]  [facility] [address] [place], [place] [address] Phone: [phone] FAX: [phone]   CC: Martinique, Betty G, [address] [place] [address] Phone: [phone]  Fax: [phone]  Return to Endocrinology clinic as below: Future Appointments  Date Time Provider [place]  01/27/2020  2:00 PM Jamile Rekowski, Melanie Crazier, MD [facility] None  01/30/2020 10:00 AM [facility]-NURSE [facility]  02/15/2020 11:00 AM Tamela Gammon, NP [facility]  05/01/2020 10:00 AM [place], Betty G, MD [facility]

## 2020-01-27 NOTE — Patient Instructions (Addendum)
-   Change Janumet 50-1060m daily  And take 1 tablet with Breakfast and 1 tablet with supper - Increase Basaglar  to 20 units daily     HOW TO TREAT LOW BLOOD SUGARS (Blood sugar LESS THAN 70 MG/DL)  Please follow the RULE OF 15 for the treatment of hypoglycemia treatment (when your (blood sugars are less than 70 mg/dL)    STEP 1: Take 15 grams of carbohydrates when your blood sugar is low, which includes:   3-4 GLUCOSE TABS  OR  3-4 OZ OF JUICE OR REGULAR SODA OR  ONE TUBE OF GLUCOSE GEL     STEP 2: RECHECK blood sugar in 15 MINUTES STEP 3: If your blood sugar is still low at the 15 minute recheck --> then, go back to STEP 1 and treat AGAIN with another 15 grams of carbohydrates.

## 2020-01-30 ENCOUNTER — Ambulatory Visit (INDEPENDENT_AMBULATORY_CARE_PROVIDER_SITE_OTHER): Payer: 59 | Admitting: *Deleted

## 2020-01-30 ENCOUNTER — Other Ambulatory Visit: Payer: Self-pay

## 2020-01-30 DIAGNOSIS — Z23 Encounter for immunization: Secondary | ICD-10-CM

## 2020-02-13 ENCOUNTER — Other Ambulatory Visit: Payer: Self-pay

## 2020-02-13 DIAGNOSIS — I10 Essential (primary) hypertension: Secondary | ICD-10-CM

## 2020-02-14 ENCOUNTER — Ambulatory Visit: Payer: 59 | Admitting: Family Medicine

## 2020-02-15 ENCOUNTER — Other Ambulatory Visit: Payer: Self-pay

## 2020-02-15 ENCOUNTER — Other Ambulatory Visit: Payer: 59

## 2020-02-15 ENCOUNTER — Ambulatory Visit (INDEPENDENT_AMBULATORY_CARE_PROVIDER_SITE_OTHER): Payer: 59 | Admitting: Nurse Practitioner

## 2020-02-15 ENCOUNTER — Encounter: Payer: Self-pay | Admitting: Nurse Practitioner

## 2020-02-15 VITALS — BP 126/84 | Ht 69.0 in | Wt 172.0 lb

## 2020-02-15 DIAGNOSIS — I10 Essential (primary) hypertension: Secondary | ICD-10-CM

## 2020-02-15 DIAGNOSIS — Z113 Encounter for screening for infections with a predominantly sexual mode of transmission: Secondary | ICD-10-CM | POA: Diagnosis not present

## 2020-02-15 DIAGNOSIS — B373 Candidiasis of vulva and vagina: Secondary | ICD-10-CM | POA: Diagnosis not present

## 2020-02-15 DIAGNOSIS — Z01419 Encounter for gynecological examination (general) (routine) without abnormal findings: Secondary | ICD-10-CM | POA: Diagnosis not present

## 2020-02-15 DIAGNOSIS — B3731 Acute candidiasis of vulva and vagina: Secondary | ICD-10-CM

## 2020-02-15 LAB — BASIC METABOLIC PANEL
BUN/Creatinine Ratio: 21 (ref 9–23)
BUN: 13 mg/dL (ref 6–24)
CO2: 23 mmol/L (ref 20–29)
Calcium: 9.1 mg/dL (ref 8.7–10.2)
Chloride: 99 mmol/L (ref 96–106)
Creatinine, Ser: 0.62 mg/dL (ref 0.57–1.00)
GFR calc Af Amer: 122 mL/min/{1.73_m2} (ref >59)
GFR calc non Af Amer: 106 mL/min/{1.73_m2} (ref >59)
Glucose: 225 mg/dL — ABNORMAL HIGH (ref 65–99)
Potassium: 4.1 mmol/L (ref 3.5–5.2)
Sodium: 136 mmol/L (ref 134–144)

## 2020-02-15 MED ORDER — FLUCONAZOLE 150 MG PO TABS: 150.0000 mg | ORAL_TABLET | Freq: Once | ORAL | 0 refills | Status: AC

## 2020-02-15 NOTE — Patient Instructions (Signed)
Health Maintenance, Female Adopting a healthy lifestyle and getting preventive care are important in promoting health and wellness. Ask your health care provider about:  The right schedule for you to have regular tests and exams.  Things you can do on your own to prevent diseases and keep yourself healthy. What should I know about diet, weight, and exercise? Eat a healthy diet   Eat a diet that includes plenty of vegetables, fruits, low-fat dairy products, and lean protein.  Do not eat a lot of foods that are high in solid fats, added sugars, or sodium. Maintain a healthy weight Body mass index (BMI) is used to identify weight problems. It estimates body fat based on height and weight. Your health care provider can help determine your BMI and help you achieve or maintain a healthy weight. Get regular exercise Get regular exercise. This is one of the most important things you can do for your health. Most adults should:  Exercise for at least 150 minutes each week. The exercise should increase your heart rate and make you sweat (moderate-intensity exercise).  Do strengthening exercises at least twice a week. This is in addition to the moderate-intensity exercise.  Spend less time sitting. Even light physical activity can be beneficial. Watch cholesterol and blood lipids Have your blood tested for lipids and cholesterol at 49 years of age, then have this test every 5 years. Have your cholesterol levels checked more often if:  Your lipid or cholesterol levels are high.  You are older than 49 years of age.  You are at high risk for heart disease. What should I know about cancer screening? Depending on your health history and family history, you may need to have cancer screening at various ages. This may include screening for:  Breast cancer.  Cervical cancer.  Colorectal cancer.  Skin cancer.  Lung cancer. What should I know about heart disease, diabetes, and high blood  pressure? Blood pressure and heart disease  High blood pressure causes heart disease and increases the risk of stroke. This is more likely to develop in people who have high blood pressure readings, are of African descent, or are overweight.  Have your blood pressure checked: ? Every 3-5 years if you are 18-39 years of age. ? Every year if you are 40 years old or older. Diabetes Have regular diabetes screenings. This checks your fasting blood sugar level. Have the screening done:  Once every three years after age 40 if you are at a normal weight and have a low risk for diabetes.  More often and at a younger age if you are overweight or have a high risk for diabetes. What should I know about preventing infection? Hepatitis B If you have a higher risk for hepatitis B, you should be screened for this virus. Talk with your health care provider to find out if you are at risk for hepatitis B infection. Hepatitis C Testing is recommended for:  Everyone born from 1945 through 1965.  Anyone with known risk factors for hepatitis C. Sexually transmitted infections (STIs)  Get screened for STIs, including gonorrhea and chlamydia, if: ? You are sexually active and are younger than 49 years of age. ? You are older than 49 years of age and your health care provider tells you that you are at risk for this type of infection. ? Your sexual activity has changed since you were last screened, and you are at increased risk for chlamydia or gonorrhea. Ask your health care provider if   you are at risk.  Ask your health care provider about whether you are at high risk for HIV. Your health care provider may recommend a prescription medicine to help prevent HIV infection. If you choose to take medicine to prevent HIV, you should first get tested for HIV. You should then be tested every 3 months for as long as you are taking the medicine. Pregnancy  If you are about to stop having your period (premenopausal) and  you may become pregnant, seek counseling before you get pregnant.  Take 400 to 800 micrograms (mcg) of folic acid every day if you become pregnant.  Ask for birth control (contraception) if you want to prevent pregnancy. Osteoporosis and menopause Osteoporosis is a disease in which the bones lose minerals and strength with aging. This can result in bone fractures. If you are 65 years old or older, or if you are at risk for osteoporosis and fractures, ask your health care provider if you should:  Be screened for bone loss.  Take a calcium or vitamin D supplement to lower your risk of fractures.  Be given hormone replacement therapy (HRT) to treat symptoms of menopause. Follow these instructions at home: Lifestyle  Do not use any products that contain nicotine or tobacco, such as cigarettes, e-cigarettes, and chewing tobacco. If you need help quitting, ask your health care provider.  Do not use street drugs.  Do not share needles.  Ask your health care provider for help if you need support or information about quitting drugs. Alcohol use  Do not drink alcohol if: ? Your health care provider tells you not to drink. ? You are pregnant, may be pregnant, or are planning to become pregnant.  If you drink alcohol: ? Limit how much you use to 0-1 drink a day. ? Limit intake if you are breastfeeding.  Be aware of how much alcohol is in your drink. In the [place] one drink equals one 12 oz bottle of beer (355 mL), one 5 oz glass of wine (148 mL), or one 1 oz glass of hard liquor (44 mL). General instructions  Schedule regular health, dental, and eye exams.  Stay current with your vaccines.  Tell your health care provider if: ? You often feel depressed. ? You have ever been abused or do not feel safe at home. Summary  Adopting a healthy lifestyle and getting preventive care are important in promoting health and wellness.  Follow your health care provider's instructions about healthy  diet, exercising, and getting tested or screened for diseases.  Follow your health care provider's instructions on monitoring your cholesterol and blood pressure. This information is not intended to replace advice given to you by your health care provider. Make sure you discuss any questions you have with your health care provider. Document Revised: 02/24/2018 Document Reviewed: 02/24/2018 Elsevier Patient Education  2020 [employer]  

## 2020-02-15 NOTE — Progress Notes (Signed)
   Megan [name] 20-Mar-1970 [id number]   History:  49 y.o. [id number] presents as new patient to establish care. Moved here from [place] with her daughter.  Normal pap and mammogram history. T2DM managed by endocrinology. History of recurrent yeast infections due to uncontrolled diabetes. Sexually active, condom use consistently. Was on Depo provera in the past and COCs with pregnancies with both.   Gynecologic History Patient's last menstrual period was 02/03/2020.   Contraception: condoms Last Pap: 2019. Results were: normal Last mammogram: 12/2019. Results were: normal  Past medical history, past surgical history, family history and social history were all reviewed and documented in the EPIC chart.  ROS:  A ROS was performed and pertinent positives and negatives are included.  Exam:  Vitals:   02/15/20 1123  BP: 126/84  Weight: 172 lb (78 kg)  Height: 5' 9"  (1.753 m)   Body mass index is 25.4 kg/m.  General appearance:  Normal Thyroid:  Symmetrical, normal in size, without palpable masses or nodularity. Respiratory  Auscultation:  Clear without wheezing or rhonchi Cardiovascular  Auscultation:  Regular rate, without rubs, murmurs or gallops  Edema/varicosities:  Not grossly evident Abdominal  Soft,nontender, without masses, guarding or rebound.  Liver/spleen:  No organomegaly noted  Hernia:  None appreciated  Skin  Inspection:  Grossly normal   Breasts: Examined lying and sitting.   Right: Without masses, retractions, discharge or axillary adenopathy.   Left: Without masses, retractions, discharge or axillary adenopathy. Gentitourinary   Inguinal/mons:  Normal without inguinal adenopathy  External genitalia:  Normal  BUS/Urethra/Skene's glands:  Normal  Vagina:  Atrophic changes  Cervix:  Normal  Uterus:  Normal in size, shape and contour.  Midline and mobile  Adnexa/parametria:     Rt: Without masses or tenderness.   Lt: Without masses or tenderness.  Anus  and perineum: Normal  Digital rectal exam: Normal sphincter tone without palpated masses or tenderness  Assessment/Plan:  49 y.o. [id number] to establish car.   Well female exam with routine gynecological exam - Plan: Pap IG w/ reflex to HPV when ASC-U. Education provided on SBEs, importance of preventative screenings, current guidelines, high calcium diet, regular exercise, and multivitamin daily. Labs wit endocrinology and PCP.   Screen for STD (sexually transmitted disease) - Plan: C. trachomatis/N. gonorrhoeae RNA  Vaginal yeast infection - Plan: fluconazole (DIFLUCAN) 150 MG tablet once as needed and repeat in 3 days if symptoms persist. Take at start of signs for yeast infection. She is aware better management of diabetes will help prevent recurrences.   Screening for cervical cancer - Normal pap history.  Discussed current guidelines.  She would like to have Paps annually.  Pap with reflex done today.  Screening for breast cancer -normal mammogram history.  Continue annual screenings.  Normal breast exam today.  Follow-up in 1 year for annual.       Tamela Gammon [facility], 11:53 AM 02/15/2020

## 2020-02-16 LAB — PAP IG W/ RFLX HPV ASCU

## 2020-02-16 LAB — C. TRACHOMATIS/N. GONORRHOEAE RNA
C. trachomatis RNA, TMA: NOT DETECTED
N. gonorrhoeae RNA, TMA: NOT DETECTED

## 2020-02-20 ENCOUNTER — Telehealth (HOSPITAL_COMMUNITY): Payer: Self-pay | Admitting: Emergency Medicine

## 2020-02-20 NOTE — Telephone Encounter (Signed)
VM box not set up, cannot leave message

## 2020-02-21 ENCOUNTER — Other Ambulatory Visit: Payer: Self-pay

## 2020-02-21 ENCOUNTER — Ambulatory Visit (HOSPITAL_COMMUNITY)
Admission: RE | Admit: 2020-02-21 | Discharge: 2020-02-21 | Disposition: A | Discharge status: Home / Self Care | Payer: 59 | Source: Ambulatory Visit | Attending: Cardiology | Admitting: Cardiology

## 2020-02-21 DIAGNOSIS — R0609 Other forms of dyspnea: Secondary | ICD-10-CM

## 2020-02-21 DIAGNOSIS — R0600 Dyspnea, unspecified: Secondary | ICD-10-CM | POA: Insufficient documentation

## 2020-02-21 DIAGNOSIS — R079 Chest pain, unspecified: Secondary | ICD-10-CM | POA: Insufficient documentation

## 2020-02-21 DIAGNOSIS — Z006 Encounter for examination for normal comparison and control in clinical research program: Secondary | ICD-10-CM

## 2020-02-21 MED ORDER — METOPROLOL TARTRATE 5 MG/5ML IV SOLN: 5.0000 mg | INTRAVENOUS | Status: DC | PRN

## 2020-02-21 MED ORDER — NITROGLYCERIN 0.4 MG SL SUBL: 0.8000 mg | SUBLINGUAL_TABLET | Freq: Once | SUBLINGUAL | Status: AC

## 2020-02-21 MED ORDER — NITROGLYCERIN 0.4 MG SL SUBL
SUBLINGUAL_TABLET | SUBLINGUAL | Status: AC
  Administered 2020-02-21: 0.8 mg via SUBLINGUAL
  Filled 2020-02-21: qty 2

## 2020-02-21 MED ORDER — METOPROLOL TARTRATE 5 MG/5ML IV SOLN
INTRAVENOUS | Status: AC
  Administered 2020-02-21: 5 mg via INTRAVENOUS
  Filled 2020-02-21: qty 5

## 2020-02-21 MED ORDER — IOHEXOL 350 MG/ML SOLN
80.0000 mL | Freq: Once | INTRAVENOUS | Status: AC | PRN
  Administered 2020-02-21: 80 mL via INTRAVENOUS

## 2020-02-21 NOTE — Research (Signed)
IDENTIFY Informed Consent   Subject Name: Megan [name]  Subject met inclusion and exclusion criteria.  The informed consent form, study requirements and expectations were reviewed with the subject and questions and concerns were addressed prior to the signing of the consent form.  The subject verbalized understanding of the trial requirements.  The subject agreed to participate in the IDENTIFY trial and signed the informed consent at 1515 on 07-DEC-21.  The informed consent was obtained prior to performance of any protocol-specific procedures for the subject.  A copy of the signed informed consent was given to the subject and a copy was placed in the subject's medical record.   Sherlyn Lees

## 2020-02-29 ENCOUNTER — Ambulatory Visit: Payer: 59 | Admitting: Family Medicine

## 2020-03-07 ENCOUNTER — Other Ambulatory Visit: Payer: Self-pay

## 2020-03-07 ENCOUNTER — Ambulatory Visit (HOSPITAL_COMMUNITY)
Admission: EM | Admit: 2020-03-07 | Discharge: 2020-03-07 | Disposition: A | Discharge status: Home / Self Care | Payer: 59 | Attending: Internal Medicine | Admitting: Internal Medicine

## 2020-03-07 DIAGNOSIS — Z20822 Contact with and (suspected) exposure to covid-19: Secondary | ICD-10-CM

## 2020-03-07 NOTE — ED Triage Notes (Signed)
Pt presents today to have Covid test only. Denies symptoms. Family members have tested positive.

## 2020-03-07 NOTE — Discharge Instructions (Signed)

## 2020-03-08 LAB — SARS CORONAVIRUS 2 (TAT 6-24 HRS): SARS Coronavirus 2: NEGATIVE

## 2020-03-15 ENCOUNTER — Other Ambulatory Visit: Payer: Self-pay | Admitting: Family Medicine

## 2020-03-15 DIAGNOSIS — K219 Gastro-esophageal reflux disease without esophagitis: Secondary | ICD-10-CM

## 2020-04-13 ENCOUNTER — Other Ambulatory Visit: Payer: Self-pay | Admitting: Family Medicine

## 2020-05-01 ENCOUNTER — Encounter: Payer: 59 | Admitting: Family Medicine

## 2020-05-07 ENCOUNTER — Ambulatory Visit: Payer: 59 | Admitting: Internal Medicine

## 2020-05-08 ENCOUNTER — Other Ambulatory Visit: Payer: Self-pay

## 2020-05-09 ENCOUNTER — Ambulatory Visit (INDEPENDENT_AMBULATORY_CARE_PROVIDER_SITE_OTHER): Payer: 59 | Admitting: Family Medicine

## 2020-05-09 ENCOUNTER — Encounter: Payer: Self-pay | Admitting: Family Medicine

## 2020-05-09 VITALS — BP 120/70 | HR 88 | Resp 16 | Ht 69.0 in | Wt 175.0 lb

## 2020-05-09 DIAGNOSIS — R1031 Right lower quadrant pain: Secondary | ICD-10-CM | POA: Diagnosis not present

## 2020-05-09 DIAGNOSIS — M545 Low back pain, unspecified: Secondary | ICD-10-CM

## 2020-05-09 DIAGNOSIS — K5190 Ulcerative colitis, unspecified, without complications: Secondary | ICD-10-CM

## 2020-05-09 DIAGNOSIS — E1169 Type 2 diabetes mellitus with other specified complication: Secondary | ICD-10-CM | POA: Diagnosis not present

## 2020-05-09 DIAGNOSIS — M79605 Pain in left leg: Secondary | ICD-10-CM | POA: Diagnosis not present

## 2020-05-09 DIAGNOSIS — Z0000 Encounter for general adult medical examination without abnormal findings: Secondary | ICD-10-CM

## 2020-05-09 DIAGNOSIS — Z1159 Encounter for screening for other viral diseases: Secondary | ICD-10-CM | POA: Diagnosis not present

## 2020-05-09 DIAGNOSIS — Z23 Encounter for immunization: Secondary | ICD-10-CM | POA: Diagnosis not present

## 2020-05-09 DIAGNOSIS — E785 Hyperlipidemia, unspecified: Secondary | ICD-10-CM

## 2020-05-09 DIAGNOSIS — M25559 Pain in unspecified hip: Secondary | ICD-10-CM | POA: Diagnosis not present

## 2020-05-09 DIAGNOSIS — Z1211 Encounter for screening for malignant neoplasm of colon: Secondary | ICD-10-CM

## 2020-05-09 LAB — CBC
HCT: 42.4 % (ref 36.0–46.0)
Hemoglobin: 14.6 g/dL (ref 12.0–15.0)
MCHC: 34.4 g/dL (ref 30.0–36.0)
MCV: 86.1 fl (ref 78.0–100.0)
Platelets: 408 10*3/uL — ABNORMAL HIGH (ref 150.0–400.0)
RBC: 4.92 Mil/uL (ref 3.87–5.11)
RDW: 13.2 % (ref 11.5–15.5)
WBC: 5.6 10*3/uL (ref 4.0–10.5)

## 2020-05-09 MED ORDER — SHINGRIX 50 MCG/0.5ML IM SUSR: INTRAMUSCULAR | 1 refills | Status: DC

## 2020-05-09 MED ORDER — SHINGRIX 50 MCG/0.5ML IM SUSR
INTRAMUSCULAR | 1 refills | Status: DC
Start: 2020-05-09 — End: 2020-05-09

## 2020-05-09 NOTE — Patient Instructions (Addendum)
Today you have you routine preventive visit. A few things to remember from today's visit:   Routine general medical examination at a health care facility - Plan: Zoster Vaccine Adjuvanted [facility]) injection  Hyperlipidemia associated with type 2 diabetes mellitus ([place])  Encounter for HCV screening test for low risk patient - Plan: Hepatitis C antibody  Colon cancer screening - Plan: Ambulatory referral to Gastroenterology  RLQ abdominal pain - Plan: CBC  Hip pain  Low back pain radiating to left lower extremity  Left side back pain seemed to be a pinched nerve. Resolved, please follow if pain reoccurs. Hip pain suggest osteoarthritis. Abdominal examination normal today.  Please be sure medication list is accurate. If a new problem present, please set up appointment sooner than planned today.  At least 150 minutes of moderate exercise per week, daily brisk walking for 15-30 min is a good exercise option. Healthy diet low in saturated (animal) fats and sweets and consisting of fresh fruits and vegetables, lean meats such as fish and white chicken and whole grains.  These are some of recommendations for screening depending of age and risk factors:  - Vaccines:  Tdap vaccine every 10 years.Given today.  Shingles vaccine recommended at age 50, could be given after 50 years of age but not sure about insurance coverage.   Pneumonia vaccines: Pneumovax at 50. Sometimes Pneumovax is giving earlier if history of smoking, lung disease,diabetes,kidney disease among some. Given today.  Screening for diabetes at age 50 and every 3 years.N/A  Cervical cancer prevention:  Pap smear starts at 50 years of age and continues periodically until 50 years old in low risk women. Pap smear every 3 years between 50 and 60 years old. Pap smear every 3-5 years between women 50 and older if pap smear negative and HPV screening negative.   -Breast cancer: Mammogram: There is disagreement between  experts about when to start screening in low risk asymptomatic female but recent recommendations are to start screening at 50 and not later than 50 years old , every 1-2 years and after 50 yo q 2 years. Screening is recommended until 50 years old but some women can continue screening depending of healthy issues.  Colon cancer screening: Has been recently changed to 50 yo. Insurance may not cover until you are 50 years old. Screening is recommended until 50 years old.  Cholesterol disorder screening at age 50 and every 3 years.N/A  Also recommended:  1. Dental visit- Brush and floss your teeth twice daily; visit your dentist twice a year. 2. Eye doctor- Get an eye exam at least every 2 years. 3. Helmet use- Always wear a helmet when riding a bicycle, motorcycle, rollerblading or skateboarding. 4. Safe sex- If you may be exposed to sexually transmitted infections, use a condom. 5. Seat belts- Seat belts can save your live; always wear one. 6. Smoke/Carbon Monoxide detectors- These detectors need to be installed on the appropriate level of your home. Replace batteries at least once a year. 7. Skin cancer- When out in the sun please cover up and use sunscreen 15 SPF or higher. 8. Violence- If anyone is threatening or hurting you, please tell your healthcare provider.  9. Drink alcohol in moderation- Limit alcohol intake to one drink or less per day. Never drink and drive. 10. Calcium supplementation 1000 to 1200 mg daily, ideally through your diet.  Vitamin D supplementation 800 units daily.

## 2020-05-09 NOTE — Progress Notes (Signed)
HPI: Megan [name] is a 50 y.o. female, who is here today for her routine physical.  Last CPE: > 1 year ago. Gyn routine examination   Regular exercise 3 or more time per week: She walks a lot at work and walks her dog 1-2 blocks daily. Following a healthy diet: She tries to watch what she eats.   Chronic medical problems: DM 2, hyperlipidemia,UC, hypothyroidism,GERD,and HLD among some. She follows with Dr. Ranae Palms (endocrinologist) and Dr. Radford Pax (cardio). Pap smear:02/15/2020  Immunization History  Administered Date(s) Administered  . Influenza,inj,Quad PF,6+ Mos 01/30/2020  . PFIZER(Purple Top)SARS-COV-2 Vaccination 07/12/2019, 08/01/2019  . Pneumococcal Polysaccharide-23 05/09/2020  . Tdap 05/09/2020   Mammogram: 01/05/20 Colonoscopy: Years ago. UC in remission. DEXA: N/A Hep C screening: Never.  She has some concerns today. Left lower back pain radiated to LLE until 2 weeks ago. Took Ibuprofen. She had sciatic 2 years ago, right side. No hx of trauma.  Last right-sided abdominal pain, around appendix. No associated fever, changes in appetite,nausea,or changes in bowel habits. No urinary symptoms.  A week ago she was walking and had dizziness, had to hold the wall for a few seconds. She thinks she had already eaten lunch. Dizziness lasted a second. No associated symptoms.  "A lot of hip issues" Hip discomfort with driving, after long sitting, and in the morning when she gets up. Problem has been going for a while. Pain is mild,so she does not take med. She has not noted edema or erythema.  Review of Systems  Constitutional: Negative for appetite change, fatigue and fever.  HENT: Negative for hearing loss, mouth sores, sore throat and voice change.   Eyes: Negative for redness and visual disturbance.  Respiratory: Negative for cough, shortness of breath and wheezing.   Cardiovascular: Negative for chest pain and leg swelling.   Gastrointestinal: Negative for abdominal pain, nausea and vomiting.       No changes in bowel habits.  Endocrine: Negative for cold intolerance, heat intolerance, polydipsia, polyphagia and polyuria.  Genitourinary: Negative for decreased urine volume, dysuria, hematuria, vaginal bleeding and vaginal discharge.  Musculoskeletal: Positive for arthralgias. Negative for gait problem and myalgias.  Skin: Negative for color change and rash.  Neurological: Negative for syncope, weakness and headaches.  Hematological: Negative for adenopathy. Does not bruise/bleed easily.  Psychiatric/Behavioral: Negative for confusion and sleep disturbance. The patient is not nervous/anxious.   All other systems reviewed and are negative.  Current Outpatient Medications on File Prior to Visit  Medication Sig Dispense Refill  . Continuous Blood Gluc Receiver (DEXCOM G6 RECEIVER) DEVI 1 Device by Does not apply route as directed. 1 each 0  . Continuous Blood Gluc Sensor (DEXCOM G6 SENSOR) MISC 1 Device by Does not apply route as directed. 3 each 11  . Continuous Blood Gluc Transmit (DEXCOM G6 TRANSMITTER) MISC 1 Device by Does not apply route as directed. 1 each 3  . enalapril (VASOTEC) 10 MG tablet Take 1 tablet by mouth once daily 90 tablet 1  . Insulin Glargine (BASAGLAR KWIKPEN) 100 UNIT/ML Inject 20 Units into the skin daily. 15 mL 6  . Insulin Pen Needle 32G X 4 MM MISC 1 Device by Does not apply route as directed. 50 each 6  . levothyroxine (EUTHYROX) 125 MCG tablet Take 1 tablet (125 mcg total) by mouth daily. 90 tablet 2  . metoprolol tartrate (LOPRESSOR) 100 MG tablet Take 1 tablet (100 mg) two hours prior to CT scan. 1 tablet 0  . omeprazole (  PRILOSEC) 20 MG capsule TAKE 1 CAPSULE BY MOUTH ONCE DAILY BEFORE BREAKFAST 30 capsule 1  . rosuvastatin (CRESTOR) 20 MG tablet Take 1 tablet by mouth once daily 30 tablet 0  . sitaGLIPtin-metformin (JANUMET) 50-1000 MG tablet Take 1 tablet by mouth 2 (two) times  daily with a meal. 180 tablet 1   No current facility-administered medications on file prior to visit.   Past Medical History:  Diagnosis Date  . Diabetes mellitus without complication ([place])   . Hyperlipidemia   . Hypertension   . Metabolic syndrome   . Ulcerative colitis ([place])   . Ulcerative colitis, acute [facility])    Past Surgical History:  Procedure Laterality Date  . EYE SURGERY     No Known Allergies  Family History  Problem Relation Age of Onset  . Asthma Mother   . COPD Mother   . Hypertension Mother   . Cancer Maternal Grandfather        LUNG  . Diabetes Maternal Grandfather   . Cancer Paternal Grandmother        UTERINE    Social History   Socioeconomic History  . Marital status: Single    Spouse name: Not on file  . Number of children: 1  . Years of education: Not on file  . Highest education level: Not on file  Occupational History  . Not on file  Tobacco Use  . Smoking status: Never Smoker  . Smokeless tobacco: Never Used  Vaping Use  . Vaping Use: Never used  Substance and Sexual Activity  . Alcohol use: Yes    Comment: occasionally   . Drug use: No  . Sexual activity: Yes    Partners: Male    Comment: 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26, PARTNERS- 6, 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   Other Topics Concern  . Not on file  Social History Narrative  . Not on file   Social Determinants of Health   Financial Resource Strain: Not on file  Food Insecurity: Not on file  Transportation Needs: Not on file  Physical Activity: Not on file  Stress: Not on file  Social Connections: Not on file   Vitals:   05/09/20 0755  BP: 120/70  Pulse: 88  Resp: 16  SpO2: 97%   Body mass index is 25.84 kg/m.  Wt Readings from Last 3 Encounters:  05/09/20 175 lb (79.4 kg)  02/15/20 172 lb (78 kg)  01/27/20 178 lb 8 oz (81 kg)   Physical Exam Vitals and nursing note reviewed.  Constitutional:      General: She is not in acute distress.    Appearance: She is well-developed.   HENT:     Head: Normocephalic and atraumatic.     Right Ear: Hearing, tympanic membrane, ear canal and external ear normal.     Left Ear: Hearing, tympanic membrane, ear canal and external ear normal.     Mouth/Throat:     Mouth: Oropharynx is clear and moist and mucous membranes are normal.     Pharynx: Uvula midline.  Eyes:     Extraocular Movements: Extraocular movements intact and EOM normal.     Conjunctiva/sclera: Conjunctivae normal.     Pupils: Pupils are equal, round, and reactive to light.  Neck:     Thyroid: No thyromegaly.     Trachea: No tracheal deviation.  Cardiovascular:     Rate and Rhythm: Normal rate and regular rhythm.     Pulses:          Dorsalis pedis  pulses are 2+ on the right side and 2+ on the left side.     Heart sounds: No murmur heard.   Pulmonary:     Effort: Pulmonary effort is normal. No respiratory distress.     Breath sounds: Normal breath sounds.  Chest:  Breasts:     Right: No supraclavicular adenopathy.     Left: No supraclavicular adenopathy.    Abdominal:     Palpations: Abdomen is soft. There is no hepatomegaly or mass.     Tenderness: There is no abdominal tenderness.  Genitourinary:    Comments: Deferred to gyn. Musculoskeletal:        General: No edema.     Right hip: No tenderness or bony tenderness. Decreased range of motion (Flexion and internal rotation mainly.).     Left hip: No tenderness or bony tenderness. Decreased range of motion (Flexion and internal rotation mainly.).     Comments: No signs of synovitis appreciated.  Lymphadenopathy:     Cervical: No cervical adenopathy.     Upper Body:     Right upper body: No supraclavicular adenopathy.     Left upper body: No supraclavicular adenopathy.  Skin:    General: Skin is warm.     Findings: No erythema or rash.  Neurological:     General: No focal deficit present.     Mental Status: She is alert and oriented to person, place, and time.     Cranial Nerves: No  cranial nerve deficit.     Coordination: Coordination normal.     Gait: Gait normal.     Deep Tendon Reflexes: Strength normal.     Reflex Scores:      Bicep reflexes are 2+ on the right side and 2+ on the left side.      Patellar reflexes are 2+ on the right side and 2+ on the left side. Psychiatric:        Mood and Affect: Mood and affect normal.        Speech: Speech normal.     Comments: Well groomed, good eye contact.   ASSESSMENT AND PLAN:  Megan [name] was here today annual physical examination.  Orders Placed This Encounter  Procedures  . Tdap vaccine greater than or equal to 7yo IM  . Pneumococcal polysaccharide vaccine 23-valent greater than or equal to 2yo subcutaneous/IM  . CBC  . Hepatitis C antibody  . Ambulatory referral to Gastroenterology   Lab Results  Component Value Date   WBC 5.6 05/09/2020   HGB 14.6 05/09/2020   HCT 42.4 05/09/2020   MCV 86.1 05/09/2020   PLT 408.0 (H) 05/09/2020   Routine general medical examination at a health care facility She understand the importance of regular physical activity and healthy diet for prevention of chronic illness and/or complications. Preventive guidelines reviewed. Vaccination updated. She would like shingrix vaccine, not sure about insurance coverage, so Rx given. Continue female preventive care with her gyn. Ca++ and vit D supplementation recommended as well. Next CPE in a year.  -     Zoster Vaccine Adjuvanted [facility]) injection; 0.5 ml in muscle and repeat in 8 weeks  Hyperlipidemia associated with type 2 diabetes mellitus (HCC) Continue Crestor 20 mg daily and low fat diet. Following with endocrinologist.  Encounter for HCV screening test for low risk patient -     Hepatitis C antibody  Colon cancer screening -     Ambulatory referral to Gastroenterology  RLQ abdominal pain Abdominal examination negative, pain  was not reproducible. Introjected about warning signs.  Hip pain I  do not think imaging is needed today. OA, radiculopathy to be considered. Tylenol 500 mg 3-4 times per day prn.  Low back pain radiating to left lower extremity Resolved. Instructed about warning signs.  Need for pneumococcal vaccination -     Pneumococcal polysaccharide vaccine 23-valent greater than or equal to 2yo subcutaneous/IM  Need for Tdap vaccination -     Tdap vaccine greater than or equal to 7yo IM  Ulcerative colitis without complications, unspecified location ([place]) In remission. Referral to GI placed.  Return in 1 year (on 05/09/2021) for cpe, before if needed..  Pierce Barocio G. [place], MD  [facility]. [place] office.    Today you have you routine preventive visit. A few things to remember from today's visit:   Routine general medical examination at a health care facility - Plan: Zoster Vaccine Adjuvanted [facility]) injection  Hyperlipidemia associated with type 2 diabetes mellitus ([place])  Encounter for HCV screening test for low risk patient - Plan: Hepatitis C antibody  Colon cancer screening - Plan: Ambulatory referral to Gastroenterology  RLQ abdominal pain - Plan: CBC  Hip pain  Low back pain radiating to left lower extremity  Left side back pain seemed to be a pinched nerve. Resolved, please follow if pain reoccurs. Hip pain suggest osteoarthritis. Abdominal examination normal today.  Please be sure medication list is accurate. If a new problem present, please set up appointment sooner than planned today.  At least 150 minutes of moderate exercise per week, daily brisk walking for 15-30 min is a good exercise option. Healthy diet low in saturated (animal) fats and sweets and consisting of fresh fruits and vegetables, lean meats such as fish and white chicken and whole grains.  These are some of recommendations for screening depending of age and risk factors:  - Vaccines:  Tdap vaccine every 10 years.Given today.  Shingles vaccine  recommended at age 63, could be given after 50 years of age but not sure about insurance coverage.   Pneumonia vaccines: Pneumovax at 25. Sometimes Pneumovax is giving earlier if history of smoking, lung disease,diabetes,kidney disease among some. Given today.  Screening for diabetes at age 50 and every 3 years.N/A  Cervical cancer prevention:  Pap smear starts at 50 years of age and continues periodically until 50 years old in low risk women. Pap smear every 3 years between 43 and 63 years old. Pap smear every 3-5 years between women 93 and older if pap smear negative and HPV screening negative.   -Breast cancer: Mammogram: There is disagreement between experts about when to start screening in low risk asymptomatic female but recent recommendations are to start screening at 6 and not later than 50 years old , every 1-2 years and after 50 yo q 2 years. Screening is recommended until 50 years old but some women can continue screening depending of healthy issues.  Colon cancer screening: Has been recently changed to 50 yo. Insurance may not cover until you are 50 years old. Screening is recommended until 50 years old.  Cholesterol disorder screening at age 71 and every 3 years.N/A  Also recommended:  1. Dental visit- Brush and floss your teeth twice daily; visit your dentist twice a year. 2. Eye doctor- Get an eye exam at least every 2 years. 3. Helmet use- Always wear a helmet when riding a bicycle, motorcycle, rollerblading or skateboarding. 4. Safe sex- If you may be exposed to sexually transmitted  infections, use a condom. 5. Seat belts- Seat belts can save your live; always wear one. 6. Smoke/Carbon Monoxide detectors- These detectors need to be installed on the appropriate level of your home. Replace batteries at least once a year. 7. Skin cancer- When out in the sun please cover up and use sunscreen 15 SPF or higher. 8. Violence- If anyone is threatening or hurting you, please tell  your healthcare provider.  9. Drink alcohol in moderation- Limit alcohol intake to one drink or less per day. Never drink and drive. 10. Calcium supplementation 1000 to 1200 mg daily, ideally through your diet.  Vitamin D supplementation 800 units daily.

## 2020-05-10 LAB — HEPATITIS C ANTIBODY
Hepatitis C Ab: NONREACTIVE
SIGNAL TO CUT-OFF: 0.03 (ref <1.00)

## 2020-05-16 NOTE — Progress Notes (Signed)
Name: Megan [name]  Age/ Sex: 50 y.o., female   MRN/ DOB: [id number], 1970-05-06     PCP: Martinique, Betty G, MD   Reason for Endocrinology Evaluation: Type 2 Diabetes Mellitus  Initial Endocrine Consultative Visit: 11/15/2019    PATIENT IDENTIFIER: [name] is a 50 y.o. female with a past medical history of T2Dm and UC. The patient has followed with Endocrinology clinic since 11/15/2019 for consultative assistance with management of her diabetes.  DIABETIC HISTORY:  Ms. Foxworth was diagnosed with DM in her 25's.Ozempic - persistent hyperglycemia . Janumet started 10/2019 . Her hemoglobin A1c has ranged from 10.7% in 2020, peaking at 12.9% in 2021  On her initial visit to our clinic she had an A1c of 12.9% . She had just been started on Janumet . We started basal insulin  SUBJECTIVE:   During the last visit (01/27/2020): A1c 11.8%. Continue Janumet and increased  insulin   Today (05/17/2020): Ms. Markoff  She checks her blood sugars multiple times daily through CGM.  The patient has not had hypoglycemic episodes since the last clinic visit.    She has been without dexcom sensors for the past 2 days as [employer] had ran out.   She denies nausea or diarrhea but has an episode of dizziness but no syncope   HOME DIABETES REGIMEN:  Janumet 50-1000 mg BID Basaglar  20 units daily   Statin: yes ACE-I/ARB: yes     CONTINUOUS GLUCOSE MONITORING RECORD INTERPRETATION    Dates of Recording:2/14-2/27/2022  Sensor description:Dexcom  Results statistics:   CGM use % of time 71  Average and SD 265  Time in range  5  %  % Time Above 180 42  % Time above 250 53  % Time Below target 0      Glycemic patterns summary: hyperglycemia all day and night   Hyperglycemic episodes  All day and night   Hypoglycemic episodes occurred N/A  Overnight periods: High        DIABETIC COMPLICATIONS: Microvascular complications:    Denies: CKD, retinopathy ,  neuropathy   Last Eye Exam: Completed   Macrovascular complications:    Denies: CAD, CVA, PVD   HISTORY:  Past Medical History:  Past Medical History:  Diagnosis Date  . Diabetes mellitus without complication ([place])   . Hyperlipidemia   . Hypertension   . Metabolic syndrome   . Ulcerative colitis ([place])   . Ulcerative colitis, acute [facility])    Past Surgical History:  Past Surgical History:  Procedure Laterality Date  . EYE SURGERY      Social History:  reports that she has never smoked. She has never used smokeless tobacco. She reports current alcohol use. She reports that she does not use drugs. Family History:  Family History  Problem Relation Age of Onset  . Asthma Mother   . COPD Mother   . Hypertension Mother   . Cancer Maternal Grandfather        LUNG  . Diabetes Maternal Grandfather   . Cancer Paternal Grandmother        UTERINE     HOME MEDICATIONS: Allergies as of 05/17/2020   No Known Allergies     Medication List       Accurate as of May 17, 2020  7:43 AM. If you have any questions, ask your nurse or doctor.        Basaglar KwikPen 100 UNIT/ML Inject 20 Units into the skin daily.   Dexcom G6 Receiver  Devi 1 Device by Does not apply route as directed.   Dexcom G6 Sensor Misc 1 Device by Does not apply route as directed.   Dexcom G6 Transmitter Misc 1 Device by Does not apply route as directed.   enalapril 10 MG tablet Commonly known as: VASOTEC Take 1 tablet by mouth once daily   Insulin Pen Needle 32G X 4 MM Misc 1 Device by Does not apply route as directed.   Janumet 50-1000 MG tablet Generic drug: sitaGLIPtin-metformin Take 1 tablet by mouth 2 (two) times daily with a meal.   levothyroxine 125 MCG tablet Commonly known as: Euthyrox Take 1 tablet (125 mcg total) by mouth daily.   metoprolol tartrate 100 MG tablet Commonly known as: LOPRESSOR Take 1 tablet (100 mg) two hours prior to CT scan.   omeprazole 20 MG  capsule Commonly known as: PRILOSEC TAKE 1 CAPSULE BY MOUTH ONCE DAILY BEFORE BREAKFAST   rosuvastatin 20 MG tablet Commonly known as: CRESTOR Take 1 tablet by mouth once daily   Shingrix injection Generic drug: Zoster Vaccine Adjuvanted 0.5 ml in muscle and repeat in 8 weeks        OBJECTIVE:   Vital Signs: BP 140/86   Pulse 91   Ht 5' 9"  (1.753 m)   Wt 172 lb 4 oz (78.1 kg)   LMP 05/11/2020   SpO2 98%   BMI 25.44 kg/m    Wt Readings from Last 3 Encounters:  05/17/20 172 lb 4 oz (78.1 kg)  05/09/20 175 lb (79.4 kg)  02/15/20 172 lb (78 kg)     Exam: General: Pt appears well and is in NAD  Lungs: Clear with good BS bilat with no rales, rhonchi, or wheezes  Heart: RRR with normal S1 and S2 and no gallops; no murmurs; no rub  Abdomen: Normoactive bowel sounds, soft, nontender, without masses or organomegaly palpable  Extremities: No pretibial edema.  Neuro: MS is good with appropriate affect, pt is alert and Ox3     DM foot exam: 11/15/2019  The skin of the feet is intact without sores or ulcerations. The pedal pulses are 2+ on right and 2+ on left. The sensation is intact to a screening 5.07, 10 gram monofilament bilaterally   DATA REVIEWED:  Lab Results  Component Value Date   HGBA1C 10.0 (A) 05/17/2020   HGBA1C 11.8 (A) 01/27/2020   HGBA1C 12.9 (A) 10/31/2019   Lab Results  Component Value Date   MICROALBUR 0.8 10/31/2019   LDLCALC 166 (H) 10/31/2019   CREATININE 0.62 02/15/2020   Lab Results  Component Value Date   MICRALBCREAT 9 10/31/2019     Lab Results  Component Value Date   CHOL 238 (H) 10/31/2019   HDL 39 (L) 10/31/2019   LDLCALC 166 (H) 10/31/2019   TRIG 180 (H) 10/31/2019   CHOLHDL 6.1 (H) 10/31/2019         ASSESSMENT / PLAN / RECOMMENDATIONS:   1) Type 2 Diabetes Mellitus, Poorly controlled, With out complications - Most recent A1c of 10%. Goal A1c < 7.0 %.     - A1c down by ~ 2 % but still continues with  hyperglycemia, given slow improvement in glycemic control we have opted to start her on prandial insulin . I am also going to check GAD-65 and Islet cell Ab   MEDICATIONS: - Continue Janumet 50-1070m BID - Increase Basaglar to 26  units daily  - Start Novolog 8 units with each meal   EDUCATION / INSTRUCTIONS:  BG monitoring instructions: Patient is instructed to check her blood sugars 3 times a day, before meals   Call [facility] if: BG persistently < 70  . I reviewed the Rule of 15 for the treatment of hypoglycemia in detail with the patient. Literature supplied.    2) Diabetic complications:   Eye: Does not have known diabetic retinopathy.   Neuro/ Feet: Does not have known diabetic peripheral neuropathy .   Renal: Patient does not have known baseline CKD. She   is not on an ACEI/ARB at present.        F/U in 3 months    Signed electronically by: Mack Guise, MD  [facility]  [address] [place], [place] [address] Phone: [phone] FAX: [phone]   CC: Martinique, Betty G, [place] [address] Phone: [phone]  Fax: [phone]  Return to Endocrinology clinic as below: Future Appointments  Date Time Provider [place]  05/17/2020  7:50 AM Fanta Wimberley, Melanie Crazier, MD [id number] None

## 2020-05-17 ENCOUNTER — Ambulatory Visit (INDEPENDENT_AMBULATORY_CARE_PROVIDER_SITE_OTHER): Payer: 59 | Admitting: Internal Medicine

## 2020-05-17 ENCOUNTER — Other Ambulatory Visit: Payer: Self-pay

## 2020-05-17 ENCOUNTER — Encounter: Payer: Self-pay | Admitting: Internal Medicine

## 2020-05-17 VITALS — BP 140/86 | HR 91 | Ht 69.0 in | Wt 172.2 lb

## 2020-05-17 DIAGNOSIS — E1165 Type 2 diabetes mellitus with hyperglycemia: Secondary | ICD-10-CM

## 2020-05-17 LAB — POCT GLYCOSYLATED HEMOGLOBIN (HGB A1C): Hemoglobin A1C: 10 % — AB (ref 4.0–5.6)

## 2020-05-17 LAB — POCT GLUCOSE (DEVICE FOR HOME USE): POC Glucose: 248 mg/dl — AB (ref 70–99)

## 2020-05-17 MED ORDER — INSULIN PEN NEEDLE 29G X 5MM MISC: 1.0000 | Freq: Four times a day (QID) | 3 refills | Status: DC

## 2020-05-17 MED ORDER — BASAGLAR KWIKPEN 100 UNIT/ML ~~LOC~~ SOPN: 26.0000 [IU] | PEN_INJECTOR | Freq: Every day | SUBCUTANEOUS | 3 refills | Status: DC

## 2020-05-17 MED ORDER — NOVOLOG FLEXPEN 100 UNIT/ML ~~LOC~~ SOPN: 8.0000 [IU] | PEN_INJECTOR | Freq: Three times a day (TID) | SUBCUTANEOUS | 11 refills | Status: DC

## 2020-05-17 NOTE — Patient Instructions (Addendum)
-   Change Janumet 50-1064m daily ,1 tablet with Breakfast and 1 tablet with supper - Increase Basaglar 26 units daily  - Start Novolog 8 units with each meal      HOW TO TREAT LOW BLOOD SUGARS (Blood sugar LESS THAN 70 MG/DL)  Please follow the RULE OF 15 for the treatment of hypoglycemia treatment (when your (blood sugars are less than 70 mg/dL)    STEP 1: Take 15 grams of carbohydrates when your blood sugar is low, which includes:   3-4 GLUCOSE TABS  OR  3-4 OZ OF JUICE OR REGULAR SODA OR  ONE TUBE OF GLUCOSE GEL     STEP 2: RECHECK blood sugar in 15 MINUTES STEP 3: If your blood sugar is still low at the 15 minute recheck --> then, go back to STEP 1 and treat AGAIN with another 15 grams of carbohydrates.

## 2020-06-01 LAB — ISLET CELL AB SCREEN RFLX TO TITER: ISLET CELL ANTIBODY SCREEN: NEGATIVE

## 2020-06-01 LAB — GLUTAMIC ACID DECARBOXYLASE AUTO ABS: Glutamic Acid Decarb Ab: <5 IU/mL (ref <5)

## 2020-06-16 ENCOUNTER — Other Ambulatory Visit: Payer: Self-pay | Admitting: Family Medicine

## 2020-06-16 DIAGNOSIS — I10 Essential (primary) hypertension: Secondary | ICD-10-CM

## 2020-06-20 IMAGING — MG DIGITAL DIAGNOSTIC BILATERAL MAMMOGRAM WITH TOMO AND CAD
8 series · 8 of 24 positions shown · non-contrast
Comparison: Previous examinations in [HOSPITAL], the
most recent dated 12/18/2017

CLINICAL DATA: Follow-up 3 mm probably benign hypoechoic mass in
the 6 o'clock position of the right breast at an outside
institution.

EXAM:
DIGITAL DIAGNOSTIC BILATERAL MAMMOGRAM WITH CAD
ULTRASOUND RIGHT BREAST

[R CC synth-2D]
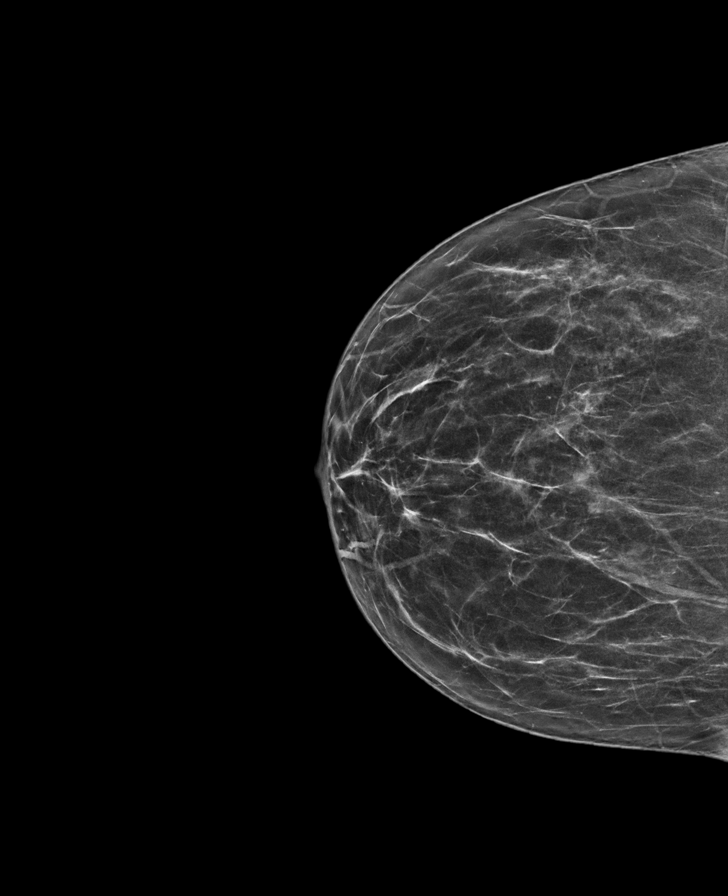

[R MLO synth-2D]
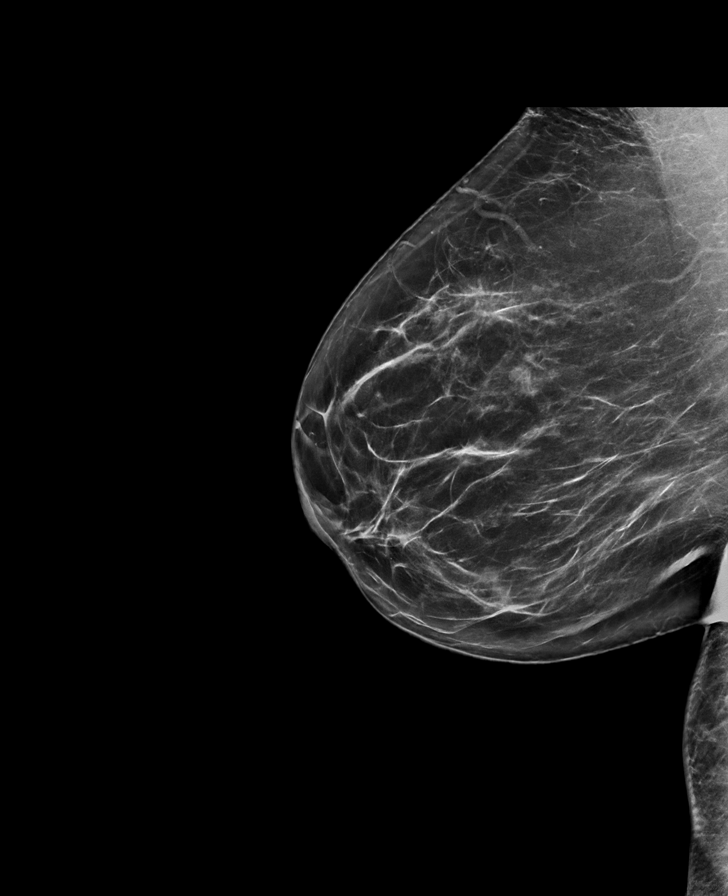

[L MLO synth-2D]
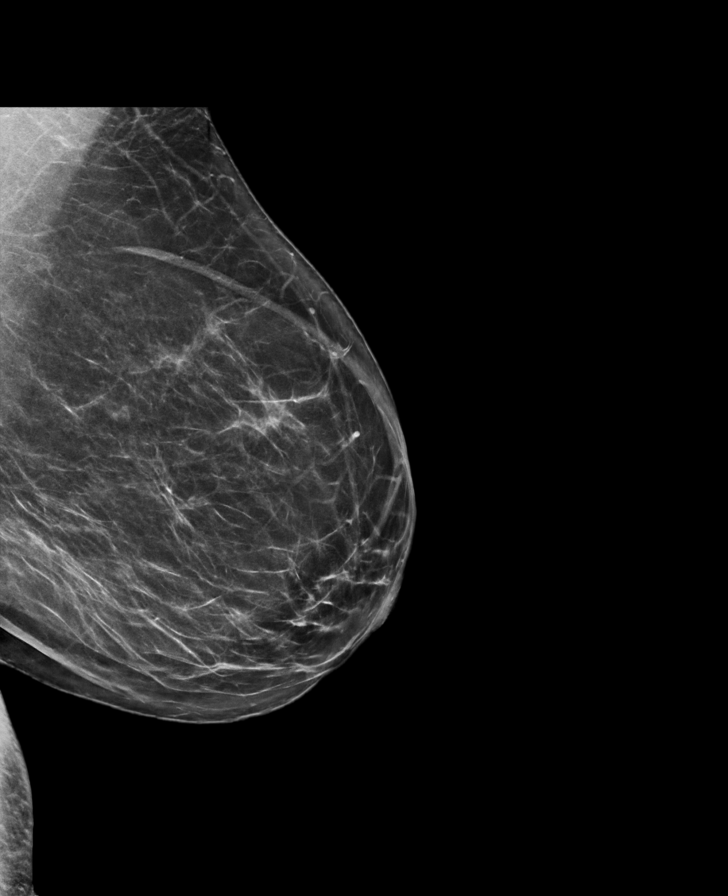

[L CC synth-2D]
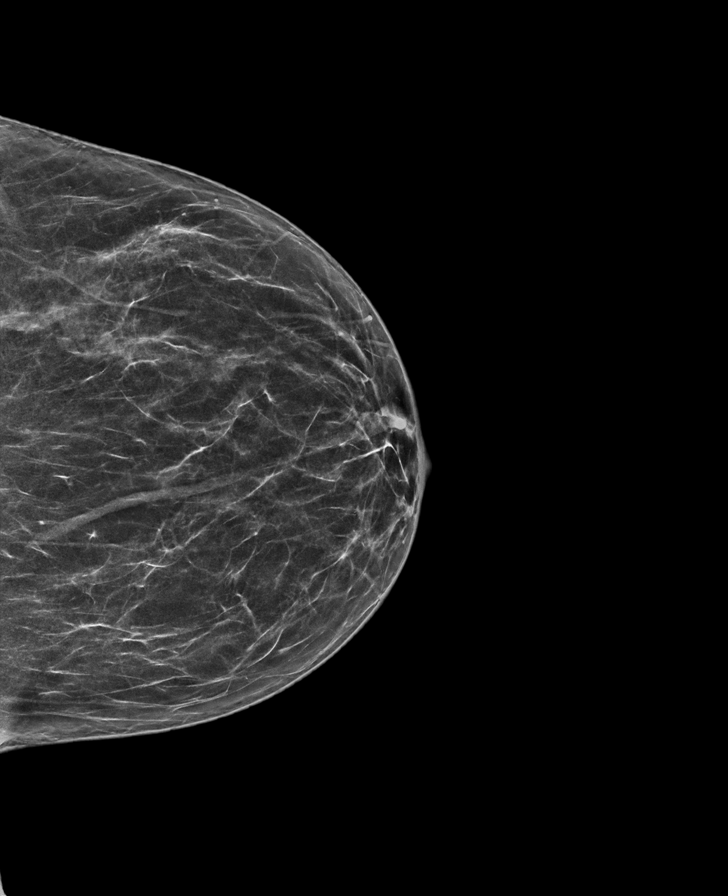

[L CC tomo · tomo slice 31/62.0]
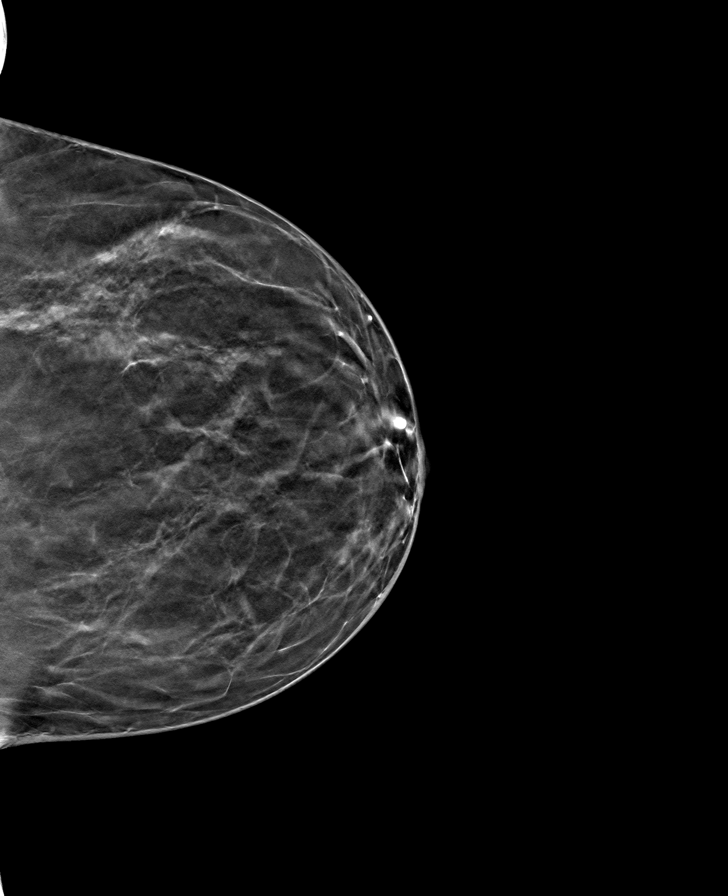

[L MLO tomo · tomo slice 39/76.0]
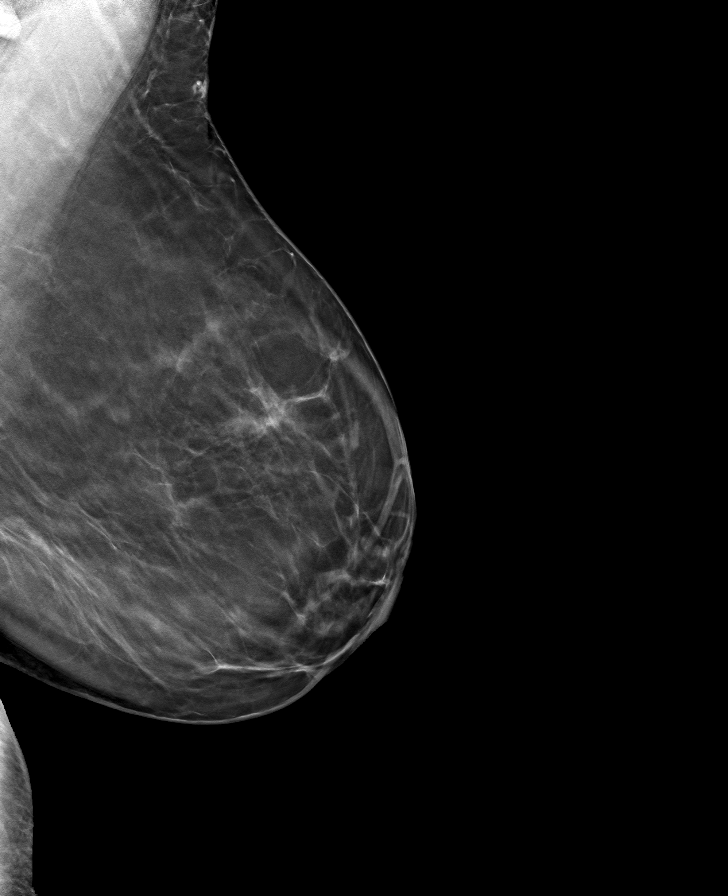

[R CC tomo · tomo slice 31/62.0]
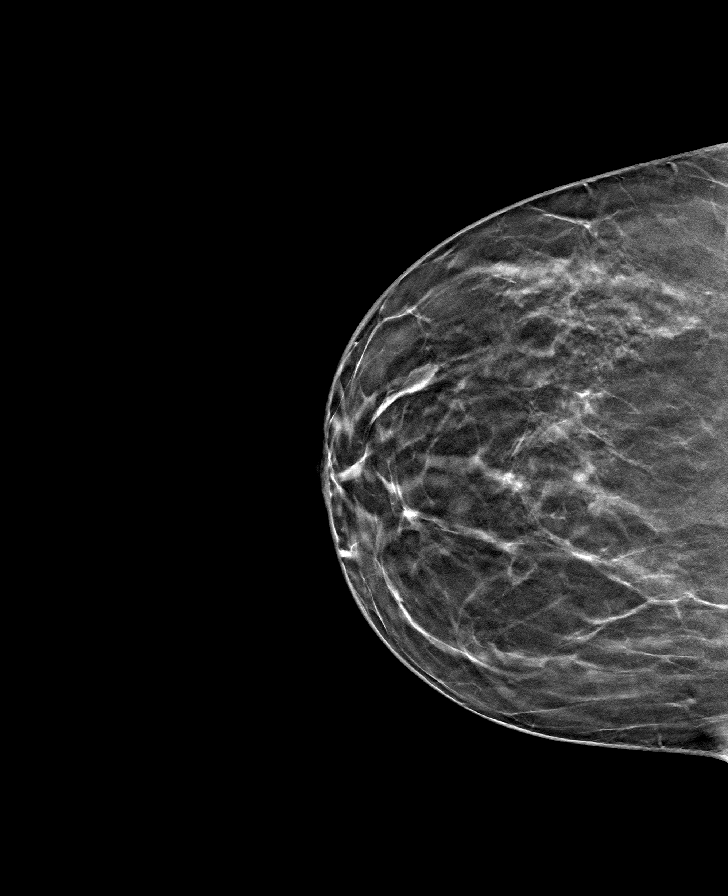

[R MLO tomo · tomo slice 39/77.0]
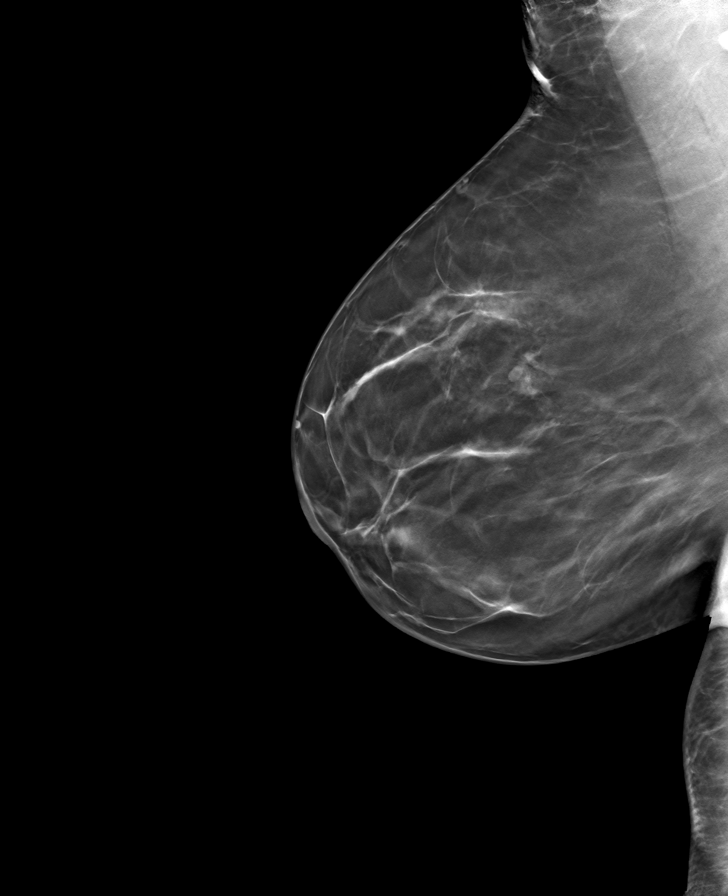

[8 of 24 positions shown; findings below may reference images not displayed]

ACR Breast Density Category b: There are scattered areas of
fibroglandular density.
FINDINGS: A previously demonstrated small, oval, circumscribed mass in 12
o'clock position of the right breast, slightly posteriorly, is
unchanged. This is better demonstrated on today's 3D images.
Previously, the patient had 2D examinations . Normal appearing
fibroglandular tissue is again demonstrated in the inferior right
breast at the location of the previously suspected 2nd asymmetry.
Stable mammographic appearance of the left breast with no findings
suspicious for malignancy.

Mammographic images were processed with CAD.

On physical exam, no mass is palpable in the 12 o'clock position of
the right breast.

Targeted ultrasound is performed, showing a 5 mm cyst in the 12
o'clock position of the right breast, 6 cm from the nipple,
corresponding to the mammographic mass. The previously seen 3 mm
rounded hypoechoic mass in the 6 o'clock position of the right
breast is no longer demonstrated.
IMPRESSION: 1. Small, benign right breast cyst.
2. No evidence of malignancy in either breast.

RECOMMENDATION:
Bilateral screening mammogram in 1 year.

I have discussed the findings and recommendations with the patient.
Results were also provided in writing at the conclusion of the
visit. If applicable, a reminder letter will be sent to the patient
regarding the next appointment.

BI-RADS CATEGORY  2: Benign.

## 2020-06-20 IMAGING — US ULTRASOUND RIGHT BREAST LIMITED
1 series · 7 of 7 positions shown · non-contrast
Comparison: Previous examinations in [HOSPITAL], the
most recent dated 12/18/2017

CLINICAL DATA: Follow-up 3 mm probably benign hypoechoic mass in
the 6 o'clock position of the right breast at an outside
institution.

EXAM:
DIGITAL DIAGNOSTIC BILATERAL MAMMOGRAM WITH CAD
ULTRASOUND RIGHT BREAST

[Series 1: ultrasound right breast limited · 0.06mm/px · 7 of 7 slices shown]
[im 1/7]
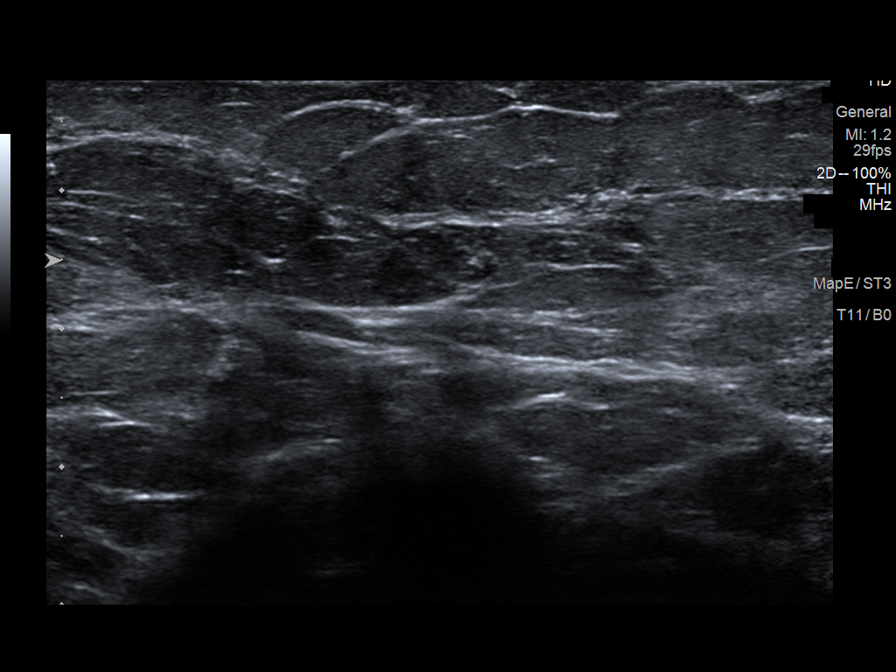
[im 2/7]
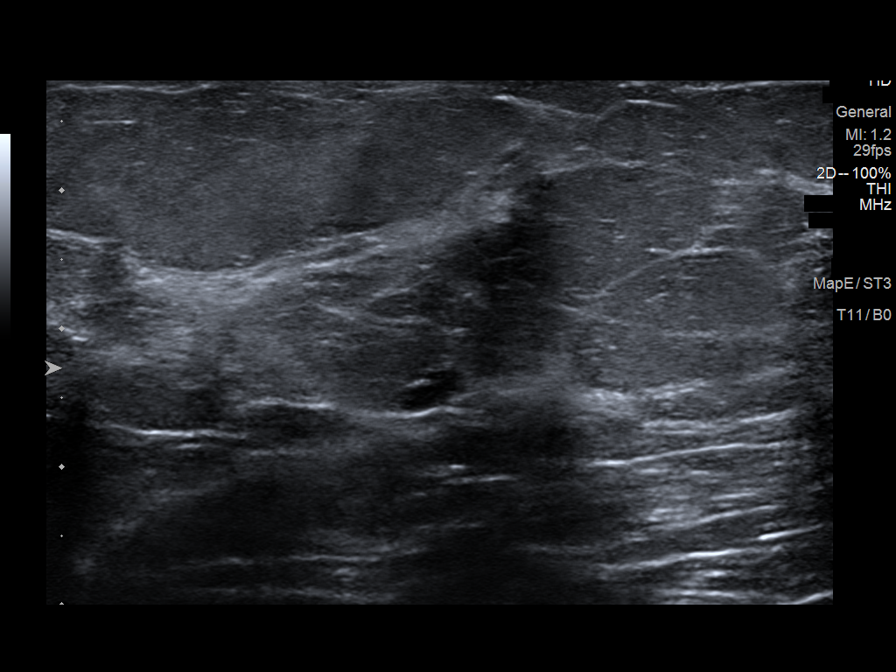
[im 3/7]
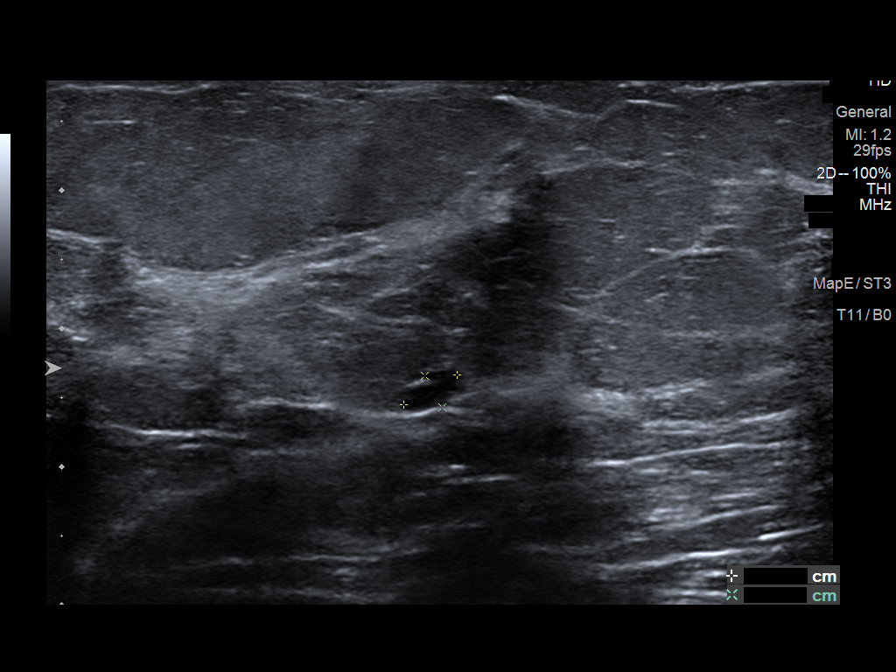
[im 4/7]
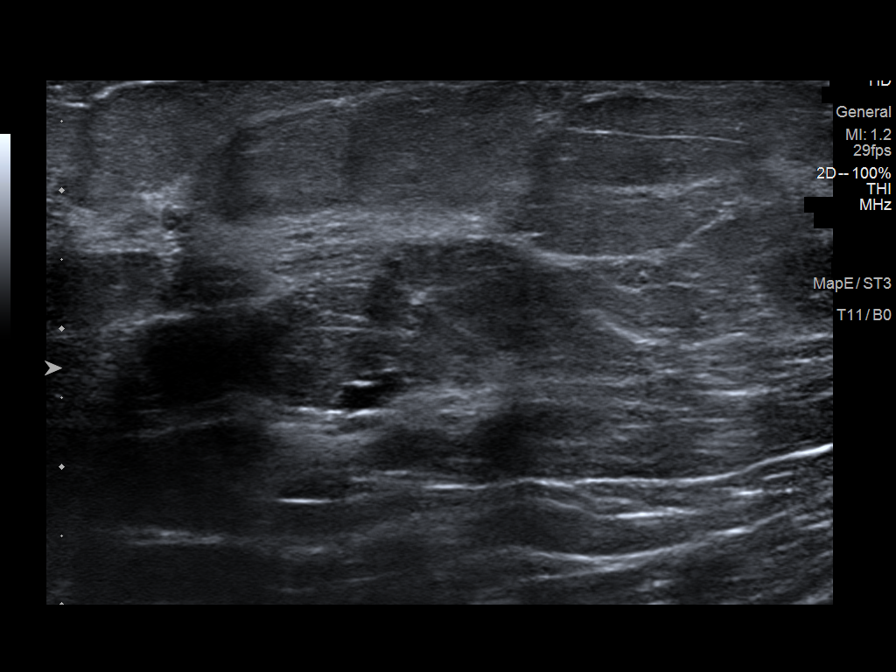
[im 5/7]
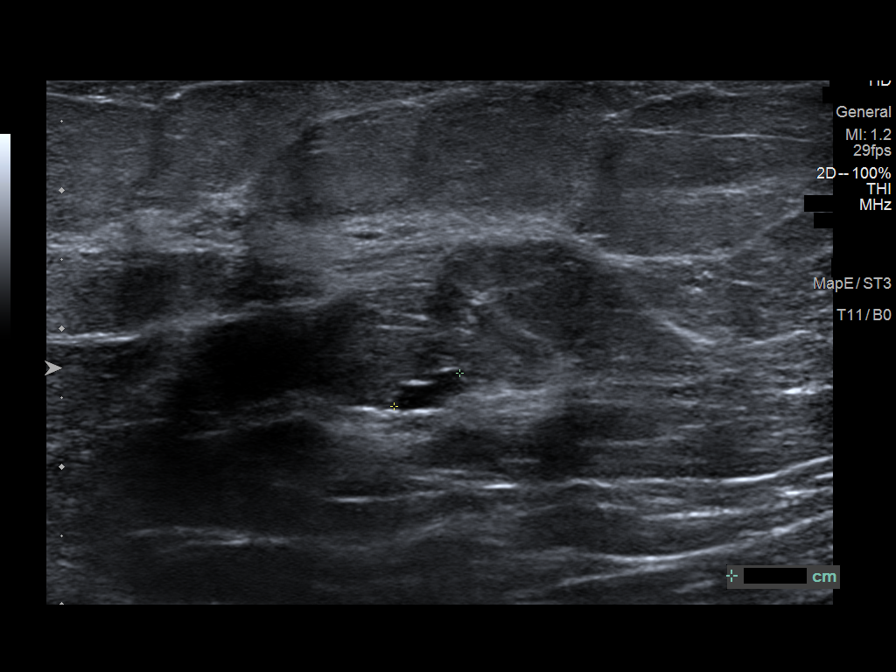
[im 6/7]
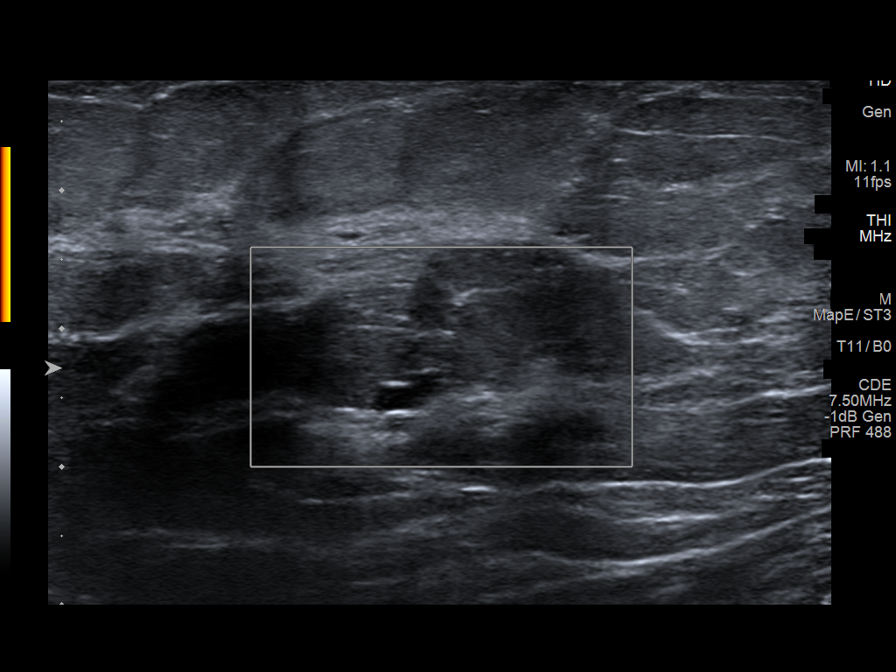
[im 7/7]
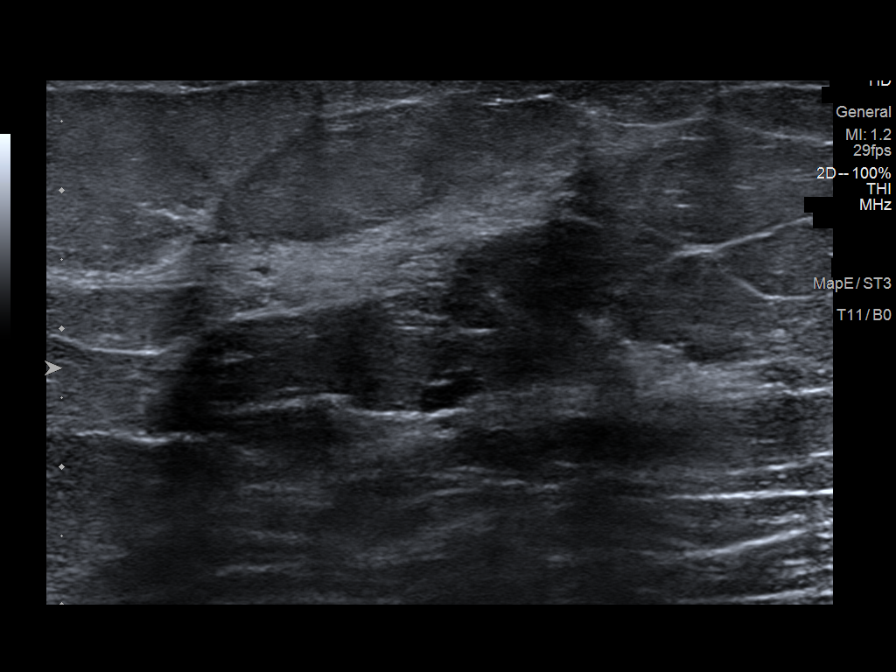

[7 of 7 positions shown; findings below may reference images not displayed]

ACR Breast Density Category b: There are scattered areas of
fibroglandular density.
FINDINGS: A previously demonstrated small, oval, circumscribed mass in 12
o'clock position of the right breast, slightly posteriorly, is
unchanged. This is better demonstrated on today's 3D images.
Previously, the patient had 2D examinations . Normal appearing
fibroglandular tissue is again demonstrated in the inferior right
breast at the location of the previously suspected 2nd asymmetry.
Stable mammographic appearance of the left breast with no findings
suspicious for malignancy.

Mammographic images were processed with CAD.

On physical exam, no mass is palpable in the 12 o'clock position of
the right breast.

Targeted ultrasound is performed, showing a 5 mm cyst in the 12
o'clock position of the right breast, 6 cm from the nipple,
corresponding to the mammographic mass. The previously seen 3 mm
rounded hypoechoic mass in the 6 o'clock position of the right
breast is no longer demonstrated.
IMPRESSION: 1. Small, benign right breast cyst.
2. No evidence of malignancy in either breast.

RECOMMENDATION:
Bilateral screening mammogram in 1 year.

I have discussed the findings and recommendations with the patient.
Results were also provided in writing at the conclusion of the
visit. If applicable, a reminder letter will be sent to the patient
regarding the next appointment.

BI-RADS CATEGORY  2: Benign.

## 2020-06-30 ENCOUNTER — Encounter: Payer: Self-pay | Admitting: Internal Medicine

## 2020-07-05 ENCOUNTER — Telehealth: Payer: Self-pay | Admitting: Cardiology

## 2020-07-05 DIAGNOSIS — Z006 Encounter for examination for normal comparison and control in clinical research program: Secondary | ICD-10-CM

## 2020-07-05 NOTE — Telephone Encounter (Signed)
Called patient for 90 day Identify phone call pt stated she is not having anymore cardiac symptoms, I reminded the patient that we would be calling her back around December for a year follow up phone call.

## 2020-07-19 ENCOUNTER — Ambulatory Visit (INDEPENDENT_AMBULATORY_CARE_PROVIDER_SITE_OTHER): Payer: 59 | Admitting: Internal Medicine

## 2020-07-19 ENCOUNTER — Encounter: Payer: Self-pay | Admitting: Internal Medicine

## 2020-07-19 ENCOUNTER — Other Ambulatory Visit: Payer: Self-pay

## 2020-07-19 VITALS — BP 136/80 | HR 90 | Ht 69.0 in | Wt 179.2 lb

## 2020-07-19 DIAGNOSIS — T63424A Toxic effect of venom of ants, undetermined, initial encounter: Secondary | ICD-10-CM

## 2020-07-19 DIAGNOSIS — E1165 Type 2 diabetes mellitus with hyperglycemia: Secondary | ICD-10-CM

## 2020-07-19 DIAGNOSIS — E1169 Type 2 diabetes mellitus with other specified complication: Secondary | ICD-10-CM

## 2020-07-19 MED ORDER — NOVOLOG FLEXPEN 100 UNIT/ML ~~LOC~~ SOPN
PEN_INJECTOR | SUBCUTANEOUS | 3 refills | Status: DC
Start: 2020-07-19 — End: 2020-10-12

## 2020-07-19 MED ORDER — BASAGLAR KWIKPEN 100 UNIT/ML ~~LOC~~ SOPN: 32.0000 [IU] | PEN_INJECTOR | Freq: Every day | SUBCUTANEOUS | 3 refills | Status: DC

## 2020-07-19 MED ORDER — DOXYCYCLINE HYCLATE 100 MG PO TABS: 100.0000 mg | ORAL_TABLET | Freq: Two times a day (BID) | ORAL | 0 refills | Status: DC

## 2020-07-19 NOTE — Progress Notes (Signed)
Name: Megan [name]  Age/ Sex: 50 y.o., female   MRN/ DOB: [id number], [birth year]     PCP: Martinique, Betty G, MD   Reason for Endocrinology Evaluation: Type 2 Diabetes Mellitus  Initial Endocrine Consultative Visit: 11/15/2019    PATIENT IDENTIFIER: Megan [name] is a 50 y.o. female with a past medical history of T2Dm and UC. The patient has followed with Endocrinology clinic since 11/15/2019 for consultative assistance with management of her diabetes.  DIABETIC HISTORY:  Megan [name] was diagnosed with DM in her 101's.Ozempic - persistent hyperglycemia . Janumet started 10/2019 . Her hemoglobin A1c has ranged from 10.7% in 2020, peaking at 12.9% in 2021  On her initial visit to our clinic she had an A1c of 12.9% . She had just been started on Janumet . We started basal insulin  SUBJECTIVE:   During the last visit (01/27/2020): A1c 11.8%. Continue Janumet and increased  insulin       Today (07/19/2020): Megan [name]  She checks her blood sugars multiple times daily through CGM.  The patient has not had hypoglycemic episodes since the last clinic visit.    She denies nausea or diarrhea   She has red ant bites    HOME DIABETES REGIMEN:  Janumet 50-1000 mg BID Basaglar  26 units daily  Novolog 8 units TIDQAC    Statin: yes ACE-I/ARB: yes     CONTINUOUS GLUCOSE MONITORING RECORD INTERPRETATION    Dates of Recording 4/22-07/19/2020 Sensor description:Dexcom  Results statistics:   CGM use % of time 57  Average and SD 257  Time in range  6 %  % Time Above 180 43  % Time above 250 51  % Time Below target 0      Glycemic patterns summary: hyperglycemia all day and night   Hyperglycemic episodes  All day and night   Hypoglycemic episodes occurred N/A  Overnight periods: High        DIABETIC COMPLICATIONS: Microvascular complications:    Denies: CKD, retinopathy , neuropathy   Last Eye Exam: Completed   Macrovascular complications:     Denies: CAD, CVA, PVD   HISTORY:  Past Medical History:  Past Medical History:  Diagnosis Date  . Diabetes mellitus without complication ([place])   . Hyperlipidemia   . Hypertension   . Metabolic syndrome   . Ulcerative colitis ([place])   . Ulcerative colitis, acute [facility])    Past Surgical History:  Past Surgical History:  Procedure Laterality Date  . EYE SURGERY      Social History:  reports that she has never smoked. She has never used smokeless tobacco. She reports current alcohol use. She reports that she does not use drugs. Family History:  Family History  Problem Relation Age of Onset  . Asthma Mother   . COPD Mother   . Hypertension Mother   . Cancer Maternal Grandfather        LUNG  . Diabetes Maternal Grandfather   . Cancer Paternal Grandmother        UTERINE     HOME MEDICATIONS: Allergies as of 07/19/2020   No Known Allergies     Medication List       Accurate as of Jul 19, 2020  8:09 AM. If you have any questions, ask your nurse or doctor.        Basaglar KwikPen 100 UNIT/ML Inject 26 Units into the skin daily.   Dexcom G6 Receiver Devi 1 Device by Does not apply route as directed.  Dexcom G6 Sensor Misc 1 Device by Does not apply route as directed.   Dexcom G6 Transmitter Misc 1 Device by Does not apply route as directed.   enalapril 10 MG tablet Commonly known as: VASOTEC Take 1 tablet by mouth once daily   Insulin Pen Needle 29G X 5MM Misc 1 Device by Does not apply route in the morning, at noon, in the evening, and at bedtime.   Janumet 50-1000 MG tablet Generic drug: sitaGLIPtin-metformin Take 1 tablet by mouth 2 (two) times daily with a meal.   levothyroxine 125 MCG tablet Commonly known as: Euthyrox Take 1 tablet (125 mcg total) by mouth daily.   metoprolol tartrate 100 MG tablet Commonly known as: LOPRESSOR Take 1 tablet (100 mg) two hours prior to CT scan.   NovoLOG FlexPen 100 UNIT/ML FlexPen Generic drug: insulin  aspart Inject 8 Units into the skin 3 (three) times daily with meals.   omeprazole 20 MG capsule Commonly known as: PRILOSEC TAKE 1 CAPSULE BY MOUTH ONCE DAILY BEFORE BREAKFAST   rosuvastatin 20 MG tablet Commonly known as: CRESTOR Take 1 tablet by mouth once daily   Shingrix injection Generic drug: Zoster Vaccine Adjuvanted 0.5 ml in muscle and repeat in 8 weeks        OBJECTIVE:   Vital Signs: BP 136/80   Pulse 90   Ht 5' 9"  (1.753 m)   Wt 179 lb 4 oz (81.3 kg)   SpO2 98%   BMI 26.47 kg/m    Wt Readings from Last 3 Encounters:  07/19/20 179 lb 4 oz (81.3 kg)  05/17/20 172 lb 4 oz (78.1 kg)  05/09/20 175 lb (79.4 kg)     Exam: General: Pt appears well and is in NAD  Lungs: Clear with good BS bilat with no rales, rhonchi, or wheezes  Heart: RRR with normal S1 and S2 and no gallops; no murmurs; no rub  Abdomen: Normoactive bowel sounds, soft, nontender, without masses or organomegaly palpable  Extremities: No pretibial edema.  Pustular lesions noted on the right ankle  Neuro: MS is good with appropriate affect, pt is alert and Ox3     DM foot exam: 11/15/2019  The skin of the feet is intact without sores or ulcerations. The pedal pulses are 2+ on right and 2+ on left. The sensation is intact to a screening 5.07, 10 gram monofilament bilaterally   DATA REVIEWED:  Lab Results  Component Value Date   HGBA1C 10.0 (A) 05/17/2020   HGBA1C 11.8 (A) 01/27/2020   HGBA1C 12.9 (A) 10/31/2019   Lab Results  Component Value Date   MICROALBUR 0.8 10/31/2019   LDLCALC 166 (H) 10/31/2019   CREATININE 0.62 02/15/2020   Lab Results  Component Value Date   MICRALBCREAT 9 10/31/2019     Lab Results  Component Value Date   CHOL 238 (H) 10/31/2019   HDL 39 (L) 10/31/2019   LDLCALC 166 (H) 10/31/2019   TRIG 180 (H) 10/31/2019   CHOLHDL 6.1 (H) 10/31/2019       ISLET CELL ANTIBODY SCREEN NEGATIVE NEGATIVE     Glutamic Acid Decarb Ab <5 IU/mL <5        ASSESSMENT / PLAN / RECOMMENDATIONS:   1) Type 2 Diabetes Mellitus, Poorly controlled, With out complications - Most recent A1c of 10.0 %. Goal A1c < 7.0 %.     - Pt continues with hyperglycemia , she also continues with dietary indiscretions .  At times she eats up to 4 meals a  day but takes prandial insulin with 3 -We discussed the importance of taking prandial insulin with each meal regardless of how many times she eats meals a day. -We discussed adding an SGLT2 inhibitor but she has not keen on further weight loss. -We will make the following insulin adjustments  MEDICATIONS: - Continue Janumet 50-1056m BID - Increase Basaglar to 32 units daily  -Increase Novolog 12 units with each meal  -CF: NovoLog (BG-130/25)  EDUCATION / INSTRUCTIONS:  BG monitoring instructions: Patient is instructed to check her blood sugars 3 times a day, before meals   [facility] if: BG persistently < 70  . I reviewed the Rule of 15 for the treatment of hypoglycemia in detail with the patient. Literature supplied.    2) Diabetic complications:   Eye: Does not have known diabetic retinopathy.   Neuro/ Feet: Does not have known diabetic peripheral neuropathy .   Renal: Patient does not have known baseline CKD. She   is not on an ACEI/ARB at present.    3) Fire aImmunologist  -A prescription of doxycycline has been sent    F/U in 3 months    Signed electronically by: AMack Guise MD  [facility]  CMaple [place]Group [place], [place]onGCalifornia [facility] [place] [address]Phone: [phone]: [phone]  CC: JMartinique Betty G, MPattersonRVerplanckNAlaska[address]Phone: [phone] Fax: [phone] Return to Endocrinology clinic as below: No future appointments.

## 2020-07-19 NOTE — Patient Instructions (Signed)
-   Continue  Janumet 50-1030m daily ,1 tablet with Breakfast and 1 tablet with supper - Increase Basaglar 32 units daily  - Increase  Novolog 12 units with each meal  - Novolog correctional insulin: ADD extra units on insulin to your meal-time Novolog  dose if your blood sugars are higher than 155. Use the scale below to help guide you:   Blood sugar before meal Number of units to inject  Less than 155 0 unit  156 -  180 1 units  181 -  205 2 units  206 -  230 3 units  231 -  255 4 units  256 -  280 5 units  281 -  305 6 units  306 -  330 7 units  331 -  355 8 units  356 - 380 9 units         HOW TO TREAT LOW BLOOD SUGARS (Blood sugar LESS THAN 70 MG/DL)  Please follow the RULE OF 15 for the treatment of hypoglycemia treatment (when your (blood sugars are less than 70 mg/dL)    STEP 1: Take 15 grams of carbohydrates when your blood sugar is low, which includes:   3-4 GLUCOSE TABS  OR  3-4 OZ OF JUICE OR REGULAR SODA OR  ONE TUBE OF GLUCOSE GEL     STEP 2: RECHECK blood sugar in 15 MINUTES STEP 3: If your blood sugar is still low at the 15 minute recheck --> then, go back to STEP 1 and treat AGAIN with another 15 grams of carbohydrates.

## 2020-07-26 ENCOUNTER — Telehealth: Payer: Self-pay | Admitting: Gastroenterology

## 2020-07-26 NOTE — Telephone Encounter (Signed)
Hi Dr. Loletha Carrow,   We received  a referral from PCP for patient to have a repeat colonoscopy. Patient had one done in 2019 obtains records for yo to review and advise on scheduling.   Thank you

## 2020-07-27 NOTE — Telephone Encounter (Signed)
Records from this patient's previous gastroenterologist (Dr. Tyrell Antonio, new [place], [place]) consist of a colonoscopy and pathology report from May 2019 as well as an office note from 2014 and several other handwritten illegible office notes.  This indicates she has a reported history of ulcerative colitis.  This patient needs a clinic visit to establish care with me before considering colonoscopy.  - H. Loletha Carrow, MD

## 2020-07-31 ENCOUNTER — Encounter: Payer: Self-pay | Admitting: Gastroenterology

## 2020-08-14 ENCOUNTER — Telehealth: Payer: Self-pay | Admitting: Internal Medicine

## 2020-08-14 NOTE — Telephone Encounter (Signed)
MEDICATION: Continuous Blood Gluc Transmit (DEXCOM G6 TRANSMITTER) MISC  PHARMACY:  [facility] on [address] PATIENT CONTACTED THEIR PHARMACY?  Yes-requires new RX  IS THIS A 90 DAY SUPPLY : Yes  IS PATIENT OUT OF MEDICATION: No  IF NOT; HOW MUCH IS LEFT: 0-Expired  LAST APPOINTMENT DATE: @5 /07/2020  NEXT APPOINTMENT DATE:@8 /07/2020  DO WE HAVE YOUR PERMISSION TO LEAVE A DETAILED MESSAGE?: Yes  OTHER COMMENTS:    **Let patient know to contact pharmacy at the end of the day to make sure medication is ready. **  ** Please notify patient to allow 48-72 hours to process**  **Encourage patient to contact the pharmacy for refills or they can request refills through [facility]**

## 2020-08-15 ENCOUNTER — Encounter: Payer: Self-pay | Admitting: Family Medicine

## 2020-08-15 ENCOUNTER — Telehealth: Payer: Self-pay | Admitting: Internal Medicine

## 2020-08-15 NOTE — Telephone Encounter (Signed)
New message    Patient checking on the status of refill prescription messages sent over on yesterday.

## 2020-08-17 ENCOUNTER — Encounter: Payer: Self-pay | Admitting: Internal Medicine

## 2020-08-17 MED ORDER — DEXCOM G6 RECEIVER DEVI: 1.0000 | 0 refills | Status: DC

## 2020-08-17 NOTE — Telephone Encounter (Signed)
Resent refill. Transmission failed

## 2020-08-17 NOTE — Telephone Encounter (Signed)
Patient called to advise that she needed the [employer] G6 transmitter not the receiver.  Please send to [facility] on [address]

## 2020-08-17 NOTE — Telephone Encounter (Signed)
Refill sent.

## 2020-08-17 NOTE — Telephone Encounter (Signed)
Pt has appt for 6/7.

## 2020-08-17 NOTE — Addendum Note (Signed)
Addended by: Jacqualin Combes on: 08/17/2020 12:43 PM   Modules accepted: Orders

## 2020-08-20 MED ORDER — DEXCOM G6 TRANSMITTER MISC: 1.0000 | 3 refills | Status: DC

## 2020-08-20 NOTE — Telephone Encounter (Signed)
Refill sent for [facility] transmitter to [facility]

## 2020-08-21 ENCOUNTER — Encounter: Payer: Self-pay | Admitting: Family Medicine

## 2020-08-21 ENCOUNTER — Ambulatory Visit (INDEPENDENT_AMBULATORY_CARE_PROVIDER_SITE_OTHER): Payer: 59 | Admitting: Family Medicine

## 2020-08-21 ENCOUNTER — Other Ambulatory Visit: Payer: Self-pay

## 2020-08-21 VITALS — BP 128/80 | HR 86 | Resp 16 | Ht 69.0 in | Wt 180.5 lb

## 2020-08-21 DIAGNOSIS — E1169 Type 2 diabetes mellitus with other specified complication: Secondary | ICD-10-CM

## 2020-08-21 DIAGNOSIS — R200 Anesthesia of skin: Secondary | ICD-10-CM

## 2020-08-21 DIAGNOSIS — E039 Hypothyroidism, unspecified: Secondary | ICD-10-CM

## 2020-08-21 DIAGNOSIS — R253 Fasciculation: Secondary | ICD-10-CM | POA: Diagnosis not present

## 2020-08-21 DIAGNOSIS — M79602 Pain in left arm: Secondary | ICD-10-CM | POA: Diagnosis not present

## 2020-08-21 DIAGNOSIS — R202 Paresthesia of skin: Secondary | ICD-10-CM

## 2020-08-21 DIAGNOSIS — Z794 Long term (current) use of insulin: Secondary | ICD-10-CM

## 2020-08-21 MED ORDER — CELECOXIB 100 MG PO CAPS
100.0000 mg | ORAL_CAPSULE | Freq: Two times a day (BID) | ORAL | 0 refills | Status: AC
Start: 2020-08-21 — End: 2020-08-28

## 2020-08-21 MED ORDER — PREDNISONE 20 MG PO TABS: ORAL_TABLET | ORAL | 0 refills | Status: DC

## 2020-08-21 NOTE — Progress Notes (Signed)
Chief Complaint  Patient presents with   left arm pain/shoulder pain   left eye    twitching   HPI: Megan [name] is a 50 y.o. female, who is here today with above complaints. About 2-3 weeks ago she started with left upper eye lid involuntary movement, intermittent. She has not identified exacerbating or alleviating factors.  No associated headaches, visual changes, muscle spasm/cramps,or fasciculations elsewhere. Problem seems to be stable. She has not been under more stress than usual  DM II: For 3 weeks she has not monitor glucose. She is on Lantus 32 U daily, Novolog before [employer].  Lab Results  Component Value Date   HGBA1C 10.0 (A) 05/17/2020   Upper back tingling like sensation, intermittently, no pain. Numbness (not sure) and LUE severe pain that extends from posterior and lateral shoulder to arm and under elbow. Pain is constant, she has not noted edema or erythema.  She is not sure about exacerbating or alleviating factors. No hx of injury but reports a fall about 2 weeks ago,landed on buttock, no serious injury.  Negative for fever,cough,wheezing,CP,palpitations,or diaphoresis.  She felt some SOB twice while she was at work last week, while "moving around."  She is not taking OTC medication.  Hypothyroidism: She is on Levothyroxine 125 mcg daily.  Lab Results  Component Value Date   TSH 1.41 10/31/2019   Lab Results  Component Value Date   CREATININE 0.62 02/15/2020   BUN 13 02/15/2020   NA 136 02/15/2020   K 4.1 02/15/2020   CL 99 02/15/2020   CO2 23 02/15/2020   Lab Results  Component Value Date   WBC 5.6 05/09/2020   HGB 14.6 05/09/2020   HCT 42.4 05/09/2020   MCV 86.1 05/09/2020   PLT 408.0 (H) 05/09/2020   Review of Systems  Constitutional: Positive for fatigue. Negative for activity change and appetite change.  HENT: Negative for mouth sores, nosebleeds, sore throat and trouble swallowing.   Cardiovascular:  Negative for leg swelling.  Gastrointestinal: Negative for abdominal pain, nausea and vomiting.       Negative for changes in bowel habits.  Endocrine: Negative for cold intolerance, heat intolerance, polydipsia, polyphagia and polyuria.  Genitourinary: Negative for decreased urine volume, dysuria and hematuria.  Neurological: Negative for syncope, facial asymmetry and weakness.  Rest see pertinent positives and negatives per HPI.  Current Outpatient Medications on File Prior to Visit  Medication Sig Dispense Refill   BD PEN NEEDLE NANO 2ND GEN 32G X 4 MM MISC USE TO INJECT INSULIN IN THE MORNING, AT NOON, AND IN THE EVENING AND AT BEDTIME.     Continuous Blood Gluc Receiver (DEXCOM G6 RECEIVER) DEVI 1 Device by Does not apply route as directed. 1 each 0   Continuous Blood Gluc Sensor (DEXCOM G6 SENSOR) MISC 1 Device by Does not apply route as directed. 3 each 11   Continuous Blood Gluc Transmit (DEXCOM G6 TRANSMITTER) MISC 1 Device by Does not apply route as directed. 1 each 3   doxycycline (VIBRA-TABS) 100 MG tablet Take 1 tablet (100 mg total) by mouth 2 (two) times daily. 14 tablet 0   enalapril (VASOTEC) 10 MG tablet Take 1 tablet by mouth once daily 90 tablet 3   insulin aspart (NOVOLOG FLEXPEN) 100 UNIT/ML FlexPen Max daily 70 units 75 mL 3   Insulin Glargine (BASAGLAR KWIKPEN) 100 UNIT/ML Inject 32 Units into the skin daily. 30 mL 3   levothyroxine (EUTHYROX) 125 MCG tablet Take 1 tablet (  125 mcg total) by mouth daily. 90 tablet 2   metoprolol tartrate (LOPRESSOR) 100 MG tablet Take 1 tablet (100 mg) two hours prior to CT scan. 1 tablet 0   omeprazole (PRILOSEC) 20 MG capsule TAKE 1 CAPSULE BY MOUTH ONCE DAILY BEFORE BREAKFAST 30 capsule 1   rosuvastatin (CRESTOR) 20 MG tablet Take 1 tablet by mouth once daily 90 tablet 3   sitaGLIPtin-metformin (JANUMET) 50-1000 MG tablet Take 1 tablet by mouth 2 (two) times daily with a meal. 180 tablet 1   Zoster Vaccine Adjuvanted [facility])  injection 0.5 ml in muscle and repeat in 8 weeks 0.5 mL 1   No current facility-administered medications on file prior to visit.   Past Medical History:  Diagnosis Date   Diabetes mellitus without complication (HCC)    Hyperlipidemia    Hypertension    Metabolic syndrome    Ulcerative colitis ([place])    Ulcerative colitis, acute ([place])    No Known Allergies  Social History   Socioeconomic History   Marital status: Single    Spouse name: Not on file   Number of children: 1   Years of education: Not on file   Highest education level: Not on file  Occupational History   Not on file  Tobacco Use   Smoking status: Never Smoker   Smokeless tobacco: Never Used  Vaping Use   Vaping Use: Never used  Substance and Sexual Activity   Alcohol use: Yes    Comment: occasionally    Drug use: No   Sexual activity: Yes    Partners: Male    Comment: 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28, PARTNERS- 6, 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   Other Topics Concern   Not on file  Social History Narrative   Not on file   Social Determinants of Health   Financial Resource Strain: Not on file  Food Insecurity: Not on file  Transportation Needs: Not on file  Physical Activity: Not on file  Stress: Not on file  Social Connections: Not on file   Vitals:   08/21/20 1550  BP: 128/80  Pulse: 86  Resp: 16  SpO2: 98%   Body mass index is 26.66 kg/m.  Physical Exam Vitals and nursing note reviewed.  Constitutional:      General: She is not in acute distress.    Appearance: She is well-developed.  HENT:     Head: Normocephalic and atraumatic.     Mouth/Throat:     Mouth: Mucous membranes are moist.  Eyes:     Extraocular Movements: Extraocular movements intact.     Conjunctiva/sclera: Conjunctivae normal.     Pupils: Pupils are equal, round, and reactive to light.  Cardiovascular:     Rate and Rhythm: Normal rate and regular rhythm.     Pulses:          Radial pulses are 2+ on the left side.     Heart  sounds: No murmur heard.   Pulmonary:     Effort: Pulmonary effort is normal. No respiratory distress.     Breath sounds: Normal breath sounds.  Abdominal:     Palpations: Abdomen is soft. There is no hepatomegaly or mass.     Tenderness: There is no abdominal tenderness.  Musculoskeletal:     Left upper arm: No edema, tenderness or bony tenderness.     Cervical back: Spasms present. No tenderness or bony tenderness. No pain with movement. Normal range of motion.     Thoracic back: No tenderness or bony  tenderness.  Lymphadenopathy:     Cervical: No cervical adenopathy.  Skin:    General: Skin is warm.     Findings: No erythema or rash.  Neurological:     General: No focal deficit present.     Mental Status: She is alert and oriented to person, place, and time.     Cranial Nerves: No cranial nerve deficit.     Motor: No tremor or pronator drift.     Gait: Gait normal.     Deep Tendon Reflexes:     Reflex Scores:      Bicep reflexes are 2+ on the right side and 2+ on the left side.      Patellar reflexes are 2+ on the right side and 2+ on the left side.    Comments: I do not appreciate left upper eye lid movement.  Psychiatric:        Mood and Affect: Mood is anxious.     Comments: Well groomed, good eye contact.   ASSESSMENT AND PLAN:  Ms.Fantashia was seen today for left arm pain/shoulder pain and left eye.  Diagnoses and all orders for this visit: Orders Placed This Encounter  Procedures   Basic metabolic panel   Magnesium   TSH   Vitamin B12   Lab Results  Component Value Date   DVVOHYWV37 106 08/21/2020   Lab Results  Component Value Date   TSH 0.12 (L) 08/21/2020   Lab Results  Component Value Date   CREATININE 0.62 08/21/2020   BUN 15 08/21/2020   NA 133 (L) 08/21/2020   K 4.0 08/21/2020   CL 99 08/21/2020   CO2 25 08/21/2020   Numbness and tingling Bilateral upper back tingling. We discussed possible etiologies. Recommendations will be given  according to lab results. I do not think back imaging is needed at this time. Instructed about warning signs.  Hypothyroidism, unspecified type If not well controlled, this problem could be contributing to some of her complaints. Continue Levothyroxine 125 mcg daily. Further recommendations according to TSH.  Diffuse pain in left upper extremity ? Radicular pain. We discussed treatment options. After discussion of some side effects, she would like to try Prednisone taper. Instructed to take medication with breakfast. Celebrex 100 mg bid x 7 days and not at the same time with Prednisone. If not resolved in 4 weeks, cervical MRI needs to be considered.  -     predniSONE (DELTASONE) 20 MG tablet; 2 tabs for 5 days, 1 tabs for 3 days, 1/2 tabs for 3 days. Take tables together with breakfast. -     celecoxib (CELEBREX) 100 MG capsule; Take 1 capsule (100 mg total) by mouth 2 (two) times daily for 7 days.  Fasciculation We discussed possible etiologies. Hx and examination today do not suggest a serious process. I do not think head imaging is needed. Instructed about warning signs.  Type 2 diabetes mellitus with other specified complication, with long-term current use of insulin (HCC) Poorly controlled. Prednisone side effects discussed, instructed to monitor BS's closely. Increase Lantus from 32 U to 37 U while on Prednisone + 1 more week. Following with endocrinologist.  Spent 40 minutes.  During this time history was obtained and documented, examination was performed, prior labs reviewed, and assessment/plan discussed.  Return if symptoms worsen or fail to improve.   Kahealani Yankovich G. Martinique, MD  [facility] [place] office.  A few things to remember from today's visit:   Numbness and tingling - Plan: Basic metabolic  panel, Magnesium, Vitamin B12  Hypothyroidism, unspecified type - Plan: TSH  Diffuse pain in left upper extremity - Plan: predniSONE (DELTASONE) 20 MG  tablet  Fasciculation  If you need refills please call your pharmacy. Do not use My Chart to request refills or for acute issues that need immediate attention.  Monitor for new symptoms. If not resolved in 4 weeks we will need imaging.  Because Prednisone you need to monitor blood sugar closely. Increase Lantus to 37 U while on prednisone + 1 week.  Please be sure medication list is accurate. If a new problem present, please set up appointment sooner than planned today.

## 2020-08-21 NOTE — Patient Instructions (Addendum)
A few things to remember from today's visit:   Numbness and tingling - Plan: Basic metabolic panel, Magnesium, Vitamin B12  Hypothyroidism, unspecified type - Plan: TSH  Diffuse pain in left upper extremity - Plan: predniSONE (DELTASONE) 20 MG tablet  Fasciculation  If you need refills please call your pharmacy. Do not use My Chart to request refills or for acute issues that need immediate attention.  Monitor for new symptoms. If not resolved in 4 weeks we will need imaging.  Because Prednisone you need to monitor blood sugar closely. Increase Lantus to 37 U while on prednisone + 1 week.  Please be sure medication list is accurate. If a new problem present, please set up appointment sooner than planned today.

## 2020-08-22 LAB — BASIC METABOLIC PANEL
BUN: 15 mg/dL (ref 6–23)
CO2: 25 mEq/L (ref 19–32)
Calcium: 9.3 mg/dL (ref 8.4–10.5)
Chloride: 99 mEq/L (ref 96–112)
Creatinine, Ser: 0.62 mg/dL (ref 0.40–1.20)
GFR: 103.86 mL/min (ref >60.00)
Glucose, Bld: 199 mg/dL — ABNORMAL HIGH (ref 70–99)
Potassium: 4 mEq/L (ref 3.5–5.1)
Sodium: 133 mEq/L — ABNORMAL LOW (ref 135–145)

## 2020-08-22 LAB — VITAMIN B12: Vitamin B-12: 604 pg/mL (ref 211–911)

## 2020-08-22 LAB — TSH: TSH: 0.12 u[IU]/mL — ABNORMAL LOW (ref 0.35–4.50)

## 2020-08-22 LAB — MAGNESIUM: Magnesium: 1.8 mg/dL (ref 1.5–2.5)

## 2020-08-23 MED ORDER — LEVOTHYROXINE SODIUM 125 MCG PO TABS: ORAL_TABLET | ORAL | 2 refills | Status: DC

## 2020-08-30 ENCOUNTER — Telehealth: Payer: Self-pay | Admitting: Internal Medicine

## 2020-08-30 NOTE — Telephone Encounter (Signed)
Pt at front desk wondering if there is an alternative for   insulin aspart (NOVOLOG FLEXPEN) 100 UNIT/ML FlexPen  She is also wondering what she can do about the dexcom G6 tramitter.   She is having insurance problems and she can afford it.

## 2020-08-31 ENCOUNTER — Encounter: Payer: Self-pay | Admitting: Family Medicine

## 2020-09-03 ENCOUNTER — Other Ambulatory Visit: Payer: Self-pay | Admitting: Family Medicine

## 2020-09-03 MED ORDER — FLUCONAZOLE 150 MG PO TABS: 150.0000 mg | ORAL_TABLET | Freq: Once | ORAL | 0 refills | Status: AC

## 2020-09-03 NOTE — Telephone Encounter (Signed)
Please advise 

## 2020-09-04 ENCOUNTER — Ambulatory Visit: Payer: 59 | Admitting: Gastroenterology

## 2020-09-04 NOTE — Telephone Encounter (Signed)
Pt stated --have Novolog 6 pens given by her uncle and will call her insurance for alternatives. Left sample of Dexcom transmitter front office and ready to be pick up.

## 2020-09-16 ENCOUNTER — Encounter: Payer: Self-pay | Admitting: Family Medicine

## 2020-09-16 DIAGNOSIS — E039 Hypothyroidism, unspecified: Secondary | ICD-10-CM

## 2020-09-16 DIAGNOSIS — K219 Gastro-esophageal reflux disease without esophagitis: Secondary | ICD-10-CM

## 2020-09-17 ENCOUNTER — Encounter: Payer: Self-pay | Admitting: Family Medicine

## 2020-09-18 ENCOUNTER — Telehealth (INDEPENDENT_AMBULATORY_CARE_PROVIDER_SITE_OTHER): Payer: 59 | Admitting: Family Medicine

## 2020-09-18 ENCOUNTER — Encounter: Payer: Self-pay | Admitting: Family Medicine

## 2020-09-18 DIAGNOSIS — H5789 Other specified disorders of eye and adnexa: Secondary | ICD-10-CM

## 2020-09-18 MED ORDER — ERYTHROMYCIN 5 MG/GM OP OINT: 1.0000 "application " | TOPICAL_OINTMENT | Freq: Every day | OPHTHALMIC | 0 refills | Status: DC

## 2020-09-18 MED ORDER — LEVOTHYROXINE SODIUM 125 MCG PO TABS: ORAL_TABLET | ORAL | 2 refills | Status: DC

## 2020-09-18 MED ORDER — OMEPRAZOLE 20 MG PO CPDR: DELAYED_RELEASE_CAPSULE | ORAL | 1 refills | Status: DC

## 2020-09-18 NOTE — Telephone Encounter (Signed)
Patient is going to see Dr. Maudie Mercury today virtually.

## 2020-09-18 NOTE — Progress Notes (Signed)
Virtual Visit via Video Note  I connected with Megan [name]  on 09/18/20 at  4:00 PM EDT by a video enabled telemedicine application and verified that I am speaking with the correct person using two identifiers.  Location patient: home, [place] Location provider:work or home office Persons participating in the virtual visit: patient, provider  I discussed the limitations of evaluation and management by telemedicine and the availability of in person appointments. The patient expressed understanding and agreed to proceed.   HPI:  Acute telemedicine visit for eye issue: -Onset: Saturday a bug flew in her eye, she got it out -Symptoms include: mild itchy feeling in the eye and mildly red, feels like has a little puffiness under the eye -feels is improved today some -Denies:vision changes, pain, discharge, headache -  ROS: See pertinent positives and negatives per HPI.  Past Medical History:  Diagnosis Date   Diabetes mellitus without complication (HCC)    Hyperlipidemia    Hypertension    Metabolic syndrome    Ulcerative colitis ([place])    Ulcerative colitis, acute ([place])     Past Surgical History:  Procedure Laterality Date   EYE SURGERY       Current Outpatient Medications:    BD PEN NEEDLE NANO 2ND GEN 32G X 4 MM MISC, USE TO INJECT INSULIN IN THE MORNING, AT NOON, AND IN THE EVENING AND AT BEDTIME., Disp: , Rfl:    Continuous Blood Gluc Receiver (DEXCOM G6 RECEIVER) DEVI, 1 Device by Does not apply route as directed., Disp: 1 each, Rfl: 0   Continuous Blood Gluc Sensor (DEXCOM G6 SENSOR) MISC, 1 Device by Does not apply route as directed., Disp: 3 each, Rfl: 11   Continuous Blood Gluc Transmit (DEXCOM G6 TRANSMITTER) MISC, 1 Device by Does not apply route as directed., Disp: 1 each, Rfl: 3   enalapril (VASOTEC) 10 MG tablet, Take 1 tablet by mouth once daily, Disp: 90 tablet, Rfl: 3   erythromycin ophthalmic ointment, Place 1 application into the right eye at bedtime., Disp: 3.5 g,  Rfl: 0   insulin aspart (NOVOLOG FLEXPEN) 100 UNIT/ML FlexPen, Max daily 70 units, Disp: 75 mL, Rfl: 3   Insulin Glargine (BASAGLAR KWIKPEN) 100 UNIT/ML, Inject 32 Units into the skin daily., Disp: 30 mL, Rfl: 3   levothyroxine (EUTHYROX) 125 MCG tablet, 1/2 tab Tuesdays and Thursdays.1 tab rest of days., Disp: 90 tablet, Rfl: 2   omeprazole (PRILOSEC) 20 MG capsule, TAKE 1 CAPSULE BY MOUTH ONCE DAILY BEFORE BREAKFAST, Disp: 30 capsule, Rfl: 1   rosuvastatin (CRESTOR) 20 MG tablet, Take 1 tablet by mouth once daily, Disp: 90 tablet, Rfl: 3   sitaGLIPtin-metformin (JANUMET) 50-1000 MG tablet, Take 1 tablet by mouth 2 (two) times daily with a meal., Disp: 180 tablet, Rfl: 1   Zoster Vaccine Adjuvanted (SHINGRIX) injection, 0.5 ml in muscle and repeat in 8 weeks, Disp: 0.5 mL, Rfl: 1  EXAM:  VITALS per patient if applicable:  Temp 69.4  GENERAL: alert, oriented, appears well and in no acute distress  HEENT: atraumatic, conjunttiva w/ mild erythema on R, no obvious abnormalities on inspection of external nose and ears  NECK: normal movements of the head and neck  LUNGS: on inspection no signs of respiratory distress, breathing rate appears normal, no obvious gross SOB, gasping or wheezing  CV: no obvious cyanosis  MS: moves all visible extremities without noticeable abnormality  PSYCH/NEURO: pleasant and cooperative, no obvious depression or anxiety, speech and thought processing grossly intact  ASSESSMENT AND PLAN:  Discussed the following assessment and plan:  Eye irritation  -we discussed possible serious and likely etiologies, options for evaluation and workup, limitations of telemedicine visit vs in person visit, treatment, treatment risks and precautions. Pt prefers to treat via telemedicine empirically rather than in person at this moment.  Advise that a good eye exam is not posisble over video so I can not rule out trauma to the eye. She opted for trial of erythro ointment but  agrees to seek inperson evaluation if worsening or if not continuing to improve over the next several days. Discussed options for inperson care if PCP office not available. Did let this patient know that I only do telemedicine on Tuesdays and Thursdays for [place]. Advised to schedule follow up visit with PCP or [facility] if any further questions or concerns to avoid delays in care.   I discussed the assessment and treatment plan with the patient. The patient was provided an opportunity to ask questions and all were answered. The patient agreed with the plan and demonstrated an understanding of the instructions.     Megan Kern, DO

## 2020-09-18 NOTE — Telephone Encounter (Signed)
Rxs have been sent in. 

## 2020-09-18 NOTE — Patient Instructions (Signed)
-  I sent the medication(s) we discussed to your pharmacy: Meds ordered this encounter  Medications   erythromycin ophthalmic ointment    Sig: Place 1 application into the right eye at bedtime.    Dispense:  3.5 g    Refill:  0    I hope you are feeling better soon!  Seek in person care promptly if your symptoms worsen, new concerns arise or you are not improving with treatment.  It was nice to meet you today. I help [facility] out with telemedicine visits on Tuesdays and Thursdays and am available for visits on those days. If you have any concerns or questions following this visit please schedule a follow up visit with your Primary Care doctor or seek care at a local urgent care clinic to avoid delays in care.

## 2020-09-18 NOTE — Telephone Encounter (Signed)
The patient needs refills on her  levothyroxine (EUTHYROX) 125 MCG tablet  omeprazole (PRILOSEC) 20 MG capsule  [id number] [address], [place] AT [address] Phone:  [phone]  Fax:  [phone]

## 2020-10-11 ENCOUNTER — Other Ambulatory Visit: Payer: Self-pay

## 2020-10-11 ENCOUNTER — Ambulatory Visit (INDEPENDENT_AMBULATORY_CARE_PROVIDER_SITE_OTHER): Payer: 59 | Admitting: Internal Medicine

## 2020-10-11 VITALS — BP 136/80 | HR 106 | Ht 69.0 in | Wt 182.0 lb

## 2020-10-11 DIAGNOSIS — E1165 Type 2 diabetes mellitus with hyperglycemia: Secondary | ICD-10-CM

## 2020-10-11 LAB — POCT GLYCOSYLATED HEMOGLOBIN (HGB A1C): Hemoglobin A1C: 9.1 % — AB (ref 4.0–5.6)

## 2020-10-11 NOTE — Progress Notes (Signed)
Name: Megan [name]  Age/ Sex: 50 y.o., female   MRN/ DOB: [id number], 03/13/1971     PCP: [place], Betty G, MD   Reason for Endocrinology Evaluation: Type 2 Diabetes Mellitus  Initial Endocrine Consultative Visit: 11/15/2019    PATIENT IDENTIFIER: [name] is a 50 y.o. female with a past medical history of T2Dm and UC. The patient has followed with Endocrinology clinic since 11/15/2019 for consultative assistance with management of her diabetes.  DIABETIC HISTORY:  Megan [name] was diagnosed with DM in her 81's.Ozempic - persistent hyperglycemia . Janumet started 10/2019 . Her hemoglobin A1c has ranged from 10.7% in 2020, peaking at 12.9% in 2021  On her initial visit to our clinic she had an A1c of 12.9% . She had just been started on Janumet . We started basal insulin    Prandial insulin started 07/2020   SUBJECTIVE:   During the last visit (07/19/2020): A1c 10.0 %. Continue Janumet and increased  insulin and started prandial insulin      Today (10/11/2020): Megan [name]  She checks her blood sugars multiple times daily through CGM.  The patient has not had hypoglycemic episodes since the last clinic visit.   Continues with dietary indiscretions  She continues with left arm pain. Took prednisone #20 tabs for PCP, did not help  HOME DIABETES REGIMEN:  Janumet 50-1000 mg BID Basaglar  32 units daily  Novolog 12 units TIDQAC     Statin: yes ACE-I/ARB: yes     CONTINUOUS GLUCOSE MONITORING RECORD INTERPRETATION    Dates of Recording:7/15-7/28/2022  Sensor description:Dexcom  Results statistics:   CGM use % of time 93  Average and SD 204/49  Time in range  35 %  % Time Above 180 49  % Time above 250 16  % Time Below target 0      Glycemic patterns summary: hyperglycemia all day and night   Hyperglycemic episodes  All day and night   Hypoglycemic episodes occurred N/A  Overnight periods: High but trends down        DIABETIC  COMPLICATIONS: Microvascular complications:   Denies: CKD, retinopathy , neuropathy  Last Eye Exam: Completed   Macrovascular complications:   Denies: CAD, CVA, PVD   HISTORY:  Past Medical History:  Past Medical History:  Diagnosis Date  . Diabetes mellitus without complication ([place])   . Hyperlipidemia   . Hypertension   . Metabolic syndrome   . Ulcerative colitis ([place])   . Ulcerative colitis, acute [facility])    Past Surgical History:  Past Surgical History:  Procedure Laterality Date  . EYE SURGERY     Social History:  reports that she has never smoked. She has never used smokeless tobacco. She reports current alcohol use. She reports that she does not use drugs. Family History:  Family History  Problem Relation Age of Onset  . Asthma Mother   . COPD Mother   . Hypertension Mother   . Cancer Maternal Grandfather        LUNG  . Diabetes Maternal Grandfather   . Cancer Paternal Grandmother        UTERINE     HOME MEDICATIONS: Allergies as of 10/11/2020   No Known Allergies      Medication List        Accurate as of October 11, 2020 11:43 AM. If you have any questions, ask your nurse or doctor.          Basaglar KwikPen 100 UNIT/ML Inject 32  Units into the skin daily.   BD Pen Needle Nano 2nd Gen 32G X 4 MM Misc Generic drug: Insulin Pen Needle USE TO INJECT INSULIN IN THE MORNING, AT NOON, AND IN THE EVENING AND AT BEDTIME.   Dexcom G6 Receiver Devi 1 Device by Does not apply route as directed.   Dexcom G6 Sensor Misc 1 Device by Does not apply route as directed.   Dexcom G6 Transmitter Misc 1 Device by Does not apply route as directed.   enalapril 10 MG tablet Commonly known as: VASOTEC Take 1 tablet by mouth once daily   erythromycin ophthalmic ointment Place 1 application into the right eye at bedtime.   Janumet 50-1000 MG tablet Generic drug: sitaGLIPtin-metformin Take 1 tablet by mouth 2 (two) times daily with a meal.   levothyroxine  125 MCG tablet Commonly known as: Euthyrox 1/2 tab Tuesdays and Thursdays.1 tab rest of days.   NovoLOG FlexPen 100 UNIT/ML FlexPen Generic drug: insulin aspart Max daily 70 units   omeprazole 20 MG capsule Commonly known as: PRILOSEC TAKE 1 CAPSULE BY MOUTH ONCE DAILY BEFORE BREAKFAST   rosuvastatin 20 MG tablet Commonly known as: CRESTOR Take 1 tablet by mouth once daily   Shingrix injection Generic drug: Zoster Vaccine Adjuvanted 0.5 ml in muscle and repeat in 8 weeks         OBJECTIVE:   Vital Signs: BP 136/80   Pulse (!) 106   Ht 5' 9"  (1.753 m)   Wt 182 lb (82.6 kg)   SpO2 99%   BMI 26.88 kg/m    Wt Readings from Last 3 Encounters:  10/11/20 182 lb (82.6 kg)  08/21/20 180 lb 8 oz (81.9 kg)  07/19/20 179 lb 4 oz (81.3 kg)     Exam: General: Pt appears well and is in NAD  Lungs: Clear with good BS bilat with no rales, rhonchi, or wheezes  Heart: RRR with normal S1 and S2 and no gallops; no murmurs; no rub  Abdomen: Normoactive bowel sounds, soft, nontender, without masses or organomegaly palpable  Extremities: No pretibial edema.  Neuro: MS is good with appropriate affect, pt is alert and Ox3     DM foot exam: 11/15/2019   The skin of the feet is intact without sores or ulcerations. The pedal pulses are 2+ on right and 2+ on left. The sensation is intact to a screening 5.07, 10 gram monofilament bilaterally   DATA REVIEWED:  Lab Results  Component Value Date   HGBA1C 9.1 (A) 10/11/2020   HGBA1C 10.0 (A) 05/17/2020   HGBA1C 11.8 (A) 01/27/2020   Lab Results  Component Value Date   MICROALBUR 0.8 10/31/2019   LDLCALC 166 (H) 10/31/2019   CREATININE 0.62 08/21/2020   Lab Results  Component Value Date   MICRALBCREAT 9 10/31/2019     Lab Results  Component Value Date   CHOL 238 (H) 10/31/2019   HDL 39 (L) 10/31/2019   LDLCALC 166 (H) 10/31/2019   TRIG 180 (H) 10/31/2019   CHOLHDL 6.1 (H) 10/31/2019       Results for Megan, [name] (MRN [id number]) as of 10/12/2020 16:30  Ref. Range 05/17/2020 08:09  ISLET CELL ANTIBODY SCREEN Latest Ref Range: NEGATIVE  NEGATIVE  Results for Megan, [name] (MRN [id number]) as of 10/12/2020 16:30  Ref. Range 05/17/2020 08:09  Glutamic Acid Decarb Ab Latest Ref Range: <5 IU/mL <5    ASSESSMENT / PLAN / RECOMMENDATIONS:   1) Type 2 Diabetes Mellitus, Poorly controlled, With  out complications - Most recent A1c of 9.1 %. Goal A1c < 7.0 %.    -Despite the improvement in her A1c, the patient continues with hyperglycemia and dietary indiscretions.  We did discuss again the importance of low-carb diet, and the importance of avoiding snacks as much as possible, and if she chooses to eat a snack she needs to avoid cereal and high starchy options. - GAD-65 and Islet cell Ab have come back negative -We will consider SGLT2 inhibitors in the future    MEDICATIONS: - Continue Janumet 50-1012m BID -Continue Basaglar 26  units daily  -Increase Novolog to 16 units with each meal  -CF: NovoLog (BG -130/20)  EDUCATION / INSTRUCTIONS: BG monitoring instructions: Patient is instructed to check her blood sugars 3 times a day, before meals  [facility] if: BG persistently < 70  I reviewed the Rule of 15 for the treatment of hypoglycemia in detail with the patient. Literature supplied.    2) Diabetic complications:  Eye: Does not have known diabetic retinopathy.  Neuro/ Feet: Does not have known diabetic peripheral neuropathy .  Renal: Patient does not have known baseline CKD. She   is not on an ACEI/ARB at present.        F/U in 4 months   Signed electronically by: AMack Guise MD  [facility]  CSouth [place]Group [address], [place] [address]Phone: [phone]: [phone]  CC: JMartinique Betty G, MWalshvilleRHavelockNAlaska[place]Phone: [phone] Fax: [phone] Return  to Endocrinology clinic as below: Future Appointments  Date Time Provider [place] 02/11/2021  8:50 AM Aashika Carta, IMelanie Crazier MD [facility] None

## 2020-10-11 NOTE — Patient Instructions (Addendum)
-   Continue  Janumet 50-1010m daily ,1 tablet with Breakfast and 1 tablet with supper - Continue Basaglar 32 units daily  - Increase  Novolog 16 units with each meal  - Novolog correctional insulin: ADD extra units on insulin to your meal-time Novolog  dose if your blood sugars are higher than 150. Use the scale below to help guide you:   Blood sugar before meal Number of units to inject  Less than 150 0 unit  151 -  170 1 units  171 - 190 2 units  191 - 210 3 units  211 - 230 4 units  231 - 260 5 units  261 - 280 6 units  281 - 300 7 units  301 - 320 8 units  321 - 340 9 units   341 - 360 10 units        HOW TO TREAT LOW BLOOD SUGARS (Blood sugar LESS THAN 70 MG/DL) Please follow the RULE OF 15 for the treatment of hypoglycemia treatment (when your (blood sugars are less than 70 mg/dL)   STEP 1: Take 15 grams of carbohydrates when your blood sugar is low, which includes:  3-4 GLUCOSE TABS  OR 3-4 OZ OF JUICE OR REGULAR SODA OR ONE TUBE OF GLUCOSE GEL    STEP 2: RECHECK blood sugar in 15 MINUTES STEP 3: If your blood sugar is still low at the 15 minute recheck --> then, go back to STEP 1 and treat AGAIN with another 15 grams of carbohydrates.

## 2020-10-12 ENCOUNTER — Encounter: Payer: Self-pay | Admitting: Internal Medicine

## 2020-10-12 MED ORDER — NOVOLOG FLEXPEN 100 UNIT/ML ~~LOC~~ SOPN: PEN_INJECTOR | SUBCUTANEOUS | 3 refills | Status: DC

## 2020-10-19 ENCOUNTER — Ambulatory Visit: Payer: 59 | Admitting: Internal Medicine

## 2020-10-31 ENCOUNTER — Telehealth: Payer: Self-pay | Admitting: Internal Medicine

## 2020-10-31 ENCOUNTER — Encounter: Payer: Self-pay | Admitting: Family Medicine

## 2020-10-31 DIAGNOSIS — E1169 Type 2 diabetes mellitus with other specified complication: Secondary | ICD-10-CM

## 2020-10-31 NOTE — Telephone Encounter (Signed)
Pt is at the front desk requesting a referral to be sent upstairs to Diabetes and Nutrition. Pt would like a call back when this is completed.

## 2020-10-31 NOTE — Telephone Encounter (Signed)
Please advise 

## 2020-11-04 ENCOUNTER — Encounter: Payer: Self-pay | Admitting: Family Medicine

## 2020-11-05 ENCOUNTER — Other Ambulatory Visit: Payer: Self-pay

## 2020-11-05 DIAGNOSIS — M79602 Pain in left arm: Secondary | ICD-10-CM

## 2020-11-06 ENCOUNTER — Ambulatory Visit: Payer: 59 | Admitting: Internal Medicine

## 2020-11-14 ENCOUNTER — Other Ambulatory Visit: Payer: Self-pay | Admitting: Family Medicine

## 2020-11-14 DIAGNOSIS — K219 Gastro-esophageal reflux disease without esophagitis: Secondary | ICD-10-CM

## 2020-11-15 ENCOUNTER — Other Ambulatory Visit: Payer: Self-pay | Admitting: Internal Medicine

## 2020-11-18 ENCOUNTER — Ambulatory Visit
Admission: RE | Admit: 2020-11-18 | Discharge: 2020-11-18 | Disposition: A | Discharge status: Home / Self Care | Payer: 59 | Source: Ambulatory Visit | Attending: Family Medicine | Admitting: Family Medicine

## 2020-11-18 ENCOUNTER — Other Ambulatory Visit: Payer: Self-pay

## 2020-11-18 DIAGNOSIS — M79602 Pain in left arm: Secondary | ICD-10-CM

## 2020-11-20 ENCOUNTER — Other Ambulatory Visit: Payer: Self-pay

## 2020-11-20 DIAGNOSIS — M5030 Other cervical disc degeneration, unspecified cervical region: Secondary | ICD-10-CM

## 2020-11-20 DIAGNOSIS — M5020 Other cervical disc displacement, unspecified cervical region: Secondary | ICD-10-CM

## 2020-12-11 ENCOUNTER — Ambulatory Visit: Payer: Self-pay

## 2020-12-19 ENCOUNTER — Ambulatory Visit: Payer: Medicaid Other | Admitting: Physical Therapy

## 2020-12-22 ENCOUNTER — Other Ambulatory Visit: Payer: Self-pay | Admitting: Internal Medicine

## 2021-01-03 ENCOUNTER — Ambulatory Visit: Payer: 59 | Admitting: Dietician

## 2021-01-14 ENCOUNTER — Encounter: Payer: Self-pay | Admitting: Family Medicine

## 2021-01-15 ENCOUNTER — Encounter: Payer: Self-pay | Admitting: Family Medicine

## 2021-01-15 ENCOUNTER — Telehealth (INDEPENDENT_AMBULATORY_CARE_PROVIDER_SITE_OTHER): Payer: Self-pay | Admitting: Family Medicine

## 2021-01-15 VITALS — Ht 69.0 in

## 2021-01-15 DIAGNOSIS — U071 COVID-19: Secondary | ICD-10-CM

## 2021-01-15 DIAGNOSIS — R051 Acute cough: Secondary | ICD-10-CM

## 2021-01-15 MED ORDER — HYDROCODONE BIT-HOMATROP MBR 5-1.5 MG/5ML PO SOLN
5.0000 mL | Freq: Two times a day (BID) | ORAL | 0 refills | Status: AC | PRN
Start: 2021-01-15 — End: 2021-01-25

## 2021-01-15 MED ORDER — MOLNUPIRAVIR EUA 200MG CAPSULE: 4.0000 | ORAL_CAPSULE | Freq: Two times a day (BID) | ORAL | 0 refills | Status: AC

## 2021-01-15 NOTE — Progress Notes (Signed)
Virtual Visit via Video Note I connected with Megan [name] on 01/15/21 by a video enabled telemedicine application and verified that I am speaking with the correct person using two identifiers.  Location patient: home Location provider:work office Persons participating in the virtual visit: patient, provider  I discussed the limitations of evaluation and management by telemedicine and the availability of in person appointments. The patient expressed understanding and agreed to proceed.  Chief Complaint  Patient presents with   Covid Positive   HPI: [name] is a 50 yo female with hx of DM 2, hypothyroidism, hypertension, and hyperlipidemia complaining of 3 to 4 days of respiratory symptoms. +Fatigue, chills, headache, body aches, decreased appetite, sore throat, nasal congestion, rhinorrhea, productive cough, and nausea. Negative for fever, anosmia,ageusia,wheezing, dyspnea, potation's, vomiting, changes in bowel habits, abdominal pain, urinary symptoms, or skin rash. She has had some chest wall soreness when coughing. Cough is interfering with his sleep, negative for hemoptysis.  She has taking Mucinex DM and cough drops.  COVID-19 home test done yesterday was positive. Some symptoms have improved.  No known sick contacts or recent travel. COVID-19 vaccination completed.  ROS: See pertinent positives and negatives per HPI.  Past Medical History:  Diagnosis Date   Diabetes mellitus without complication ([place])    Hyperlipidemia    Hypertension    Metabolic syndrome    Ulcerative colitis ([place])    Ulcerative colitis, acute ([place])    Past Surgical History:  Procedure Laterality Date   EYE SURGERY     Family History  Problem Relation Age of Onset   Asthma Mother    COPD Mother    Hypertension Mother    Cancer Maternal Grandfather        LUNG   Diabetes Maternal Grandfather    Cancer Paternal Grandmother        UTERINE   Social History   Socioeconomic  History   Marital status: Single    Spouse name: Not on file   Number of children: 1   Years of education: Not on file   Highest education level: Not on file  Occupational History   Not on file  Tobacco Use   Smoking status: Never   Smokeless tobacco: Never  Vaping Use   Vaping Use: Never used  Substance and Sexual Activity   Alcohol use: Yes    Comment: occasionally    Drug use: No   Sexual activity: Yes    Partners: Male    Comment: 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6, PARTNERS- 6, 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   Other Topics Concern   Not on file  Social History Narrative   Not on file   Social Determinants of Health   Financial Resource Strain: Not on file  Food Insecurity: Not on file  Transportation Needs: Not on file  Physical Activity: Not on file  Stress: Not on file  Social Connections: Not on file  Intimate Partner Violence: Not on file   Current Outpatient Medications:    BD PEN NEEDLE NANO 2ND GEN 32G X 4 MM MISC, USE TO INJECT INSULIN IN THE MORNING, AT NOON, AND IN THE EVENING AND AT BEDTIME., Disp: , Rfl:    Continuous Blood Gluc Receiver (DEXCOM G6 RECEIVER) DEVI, 1 Device by Does not apply route as directed., Disp: 1 each, Rfl: 0   Continuous Blood Gluc Sensor (DEXCOM G6 SENSOR) MISC, USE AS DIRECTED, Disp: 3 each, Rfl: 11   Continuous Blood Gluc Transmit (DEXCOM G6 TRANSMITTER) MISC, 1 Device by Does  not apply route as directed., Disp: 1 each, Rfl: 3   enalapril (VASOTEC) 10 MG tablet, Take 1 tablet by mouth once daily, Disp: 90 tablet, Rfl: 3   erythromycin ophthalmic ointment, Place 1 application into the right eye at bedtime., Disp: 3.5 g, Rfl: 0   insulin aspart (NOVOLOG FLEXPEN) 100 UNIT/ML FlexPen, Max daily 80 units, Disp: 75 mL, Rfl: 3   Insulin Glargine (BASAGLAR KWIKPEN) 100 UNIT/ML, Inject 32 Units into the skin daily., Disp: 30 mL, Rfl: 3   JANUMET 50-1000 MG tablet, TAKE 1 TABLET BY MOUTH TWICE DAILY WITH A MEAL, Disp: 120 tablet, Rfl: 0   levothyroxine  (EUTHYROX) 125 MCG tablet, 1/2 tab Tuesdays and Thursdays.1 tab rest of days., Disp: 90 tablet, Rfl: 2   omeprazole (PRILOSEC) 20 MG capsule, TAKE 1 CAPSULE BY MOUTH EVERY DAY BEFORE BREAKFAST, Disp: 30 capsule, Rfl: 1   rosuvastatin (CRESTOR) 20 MG tablet, Take 1 tablet by mouth once daily, Disp: 90 tablet, Rfl: 3   Zoster Vaccine Adjuvanted (SHINGRIX) injection, 0.5 ml in muscle and repeat in 8 weeks, Disp: 0.5 mL, Rfl: 1  EXAM:  VITALS per patient if applicable:Ht 5' 9"  (1.753 m)   LMP 12/22/2020   BMI 26.88 kg/m   GENERAL: alert, oriented, appears well and in no acute distress  HEENT: atraumatic, conjunctiva clear, no obvious abnormalities on inspection of external nose and ears  NECK: normal movements of the head and neck  LUNGS: on inspection no signs of respiratory distress, breathing rate appears normal, no obvious gross SOB, gasping or wheezing  CV: no obvious cyanosis  MS: moves all visible extremities without noticeable abnormality  PSYCH/NEURO: pleasant and cooperative, no obvious depression or anxiety, speech and thought processing grossly intact  ASSESSMENT AND PLAN:  Discussed the following assessment and plan:  COVID-19 virus infection - Plan: molnupiravir EUA (LAGEVRIO) 200 mg CAPS capsule We discussed Dx,possible complications and treatment options. She has a mild to moderate case with risk for complications. We discussed oral antiviral options and side effects. She agrees with trying Molnupiravir. Symptomatic treatment with plenty of fluids,rest,tylenol 500 mg 3-4 times per day prn. Throat lozenges if needed for sore throat. Instructed to complete 7 days of quarantine, note for work will be provided.  Clearly instructed about warning signs. She voices understanding and agrees with plan.  Acute cough - Plan: HYDROcodone bit-homatropine (HYCODAN) 5-1.5 MG/5ML syrup Explained that cough and congestion may last a few more days and even weeks after acute  symptoms have resolved. I do not think imaging is needed at this time. We discussed some side effects of Hycodan.  No problem-specific Assessment & Plan notes found for this encounter.  I discussed the assessment and treatment plan with the patient. The patient was provided an opportunity to ask questions and all were answered. The patient agreed with the plan and demonstrated an understanding of the instructions.  Return if symptoms worsen or fail to improve.

## 2021-01-17 ENCOUNTER — Encounter: Payer: Self-pay | Admitting: Family Medicine

## 2021-01-18 ENCOUNTER — Other Ambulatory Visit: Payer: Self-pay | Admitting: Family Medicine

## 2021-01-18 MED ORDER — ONDANSETRON HCL 4 MG PO TABS: 4.0000 mg | ORAL_TABLET | Freq: Two times a day (BID) | ORAL | 0 refills | Status: AC | PRN

## 2021-01-21 ENCOUNTER — Encounter: Payer: Self-pay | Admitting: Family Medicine

## 2021-01-21 ENCOUNTER — Other Ambulatory Visit: Payer: Self-pay | Admitting: Internal Medicine

## 2021-01-22 ENCOUNTER — Encounter: Payer: Self-pay | Admitting: Family Medicine

## 2021-01-23 ENCOUNTER — Other Ambulatory Visit: Payer: Self-pay | Admitting: Family Medicine

## 2021-01-23 DIAGNOSIS — Z1231 Encounter for screening mammogram for malignant neoplasm of breast: Secondary | ICD-10-CM

## 2021-02-11 ENCOUNTER — Ambulatory Visit: Payer: 59 | Admitting: Internal Medicine

## 2021-02-11 NOTE — Progress Notes (Deleted)
Name: Megan Megan [name]  Age/ Sex: 50 y.o., female   MRN/ DOB: [id number], [birth year]     PCP: [place], Betty G, MD   Reason for Endocrinology Evaluation: Type 2 Diabetes Mellitus  Initial Endocrine Consultative Visit: 11/15/2019    PATIENT IDENTIFIER: Megan Megan [name] is a 50 y.o. female with a past medical history of T2Dm and UC. The patient has followed with Endocrinology clinic since 11/15/2019 for consultative assistance with management of her diabetes.  DIABETIC HISTORY:  Megan Megan [name] was diagnosed with DM in her 26's.Ozempic - persistent hyperglycemia . Janumet started 10/2019 . Her hemoglobin A1c has ranged from 10.7% in 2020, peaking at 12.9% in 2021  On her initial visit to our clinic she had an A1c of 12.9% . She had just been started on Janumet . We started basal insulin    Prandial insulin started 07/2020   SUBJECTIVE:   During the last visit (10/11/2020): A1c 9.1 %. Continue Janumet and increased  insulin and started prandial insulin      Today (02/11/2021): Megan Megan [name]  She checks her blood sugars multiple times daily through CGM.  The patient has not had hypoglycemic episodes since the last clinic visit.   Continues with dietary indiscretions  She continues with left arm pain. Took prednisone #20 tabs for PCP, did not help  HOME DIABETES REGIMEN:  Janumet 50-1000 mg BID Basaglar  26 units daily  Novolog 16 units TIDQAC CF: Novolog (Bg -130/20)      Statin: yes ACE-I/ARB: yes     CONTINUOUS GLUCOSE MONITORING RECORD INTERPRETATION    Dates of Recording:7/15-7/28/2022  Sensor description:Dexcom  Results statistics:   CGM use % of time 93  Average and SD 204/49  Time in range  35 %  % Time Above 180 49  % Time above 250 16  % Time Below target 0      Glycemic patterns summary: hyperglycemia all day and night   Hyperglycemic episodes  All day and night   Hypoglycemic episodes occurred N/A  Overnight periods: High but trends  down        DIABETIC COMPLICATIONS: Microvascular complications:   Denies: CKD, retinopathy , neuropathy  Last Eye Exam: Completed   Macrovascular complications:   Denies: CAD, CVA, PVD   HISTORY:  Past Medical History:  Past Medical History:  Diagnosis Date   Diabetes mellitus without complication ([place])    Hyperlipidemia    Hypertension    Metabolic syndrome    Ulcerative colitis ([place])    Ulcerative colitis, acute ([place])    Past Surgical History:  Past Surgical History:  Procedure Laterality Date   EYE SURGERY     Social History:  reports that she has never smoked. She has never used smokeless tobacco. She reports current alcohol use. She reports that she does not use drugs. Family History:  Family History  Problem Relation Age of Onset   Asthma Mother    COPD Mother    Hypertension Mother    Cancer Maternal Grandfather        LUNG   Diabetes Maternal Grandfather    Cancer Paternal Grandmother        UTERINE     HOME MEDICATIONS: Allergies as of 02/11/2021   No Known Allergies      Medication List        Accurate as of February 11, 2021  7:19 AM. If you have any questions, ask your nurse or doctor.  Basaglar KwikPen 100 UNIT/ML Inject 32 Units into the skin daily.   BD Pen Needle Nano 2nd Gen 32G X 4 MM Misc Generic drug: Insulin Pen Needle USE TO INJECT INSULIN IN THE MORNING, AT NOON, AND IN THE EVENING AND AT BEDTIME.   Dexcom G6 Receiver Devi 1 Device by Does not apply route as directed.   Dexcom G6 Sensor Misc USE AS DIRECTED   Dexcom G6 Transmitter Misc 1 Device by Does not apply route as directed.   enalapril 10 MG tablet Commonly known as: VASOTEC Take 1 tablet by mouth once daily   erythromycin ophthalmic ointment Place 1 application into the right eye at bedtime.   Janumet 50-1000 MG tablet Generic drug: sitaGLIPtin-metformin TAKE 1 TABLET BY MOUTH TWICE DAILY WITH A MEAL   levothyroxine 125 MCG  tablet Commonly known as: Euthyrox 1/2 tab Tuesdays and Thursdays.1 tab rest of days.   NovoLOG FlexPen 100 UNIT/ML FlexPen Generic drug: insulin aspart Max daily 80 units   omeprazole 20 MG capsule Commonly known as: PRILOSEC TAKE 1 CAPSULE BY MOUTH EVERY DAY BEFORE BREAKFAST   rosuvastatin 20 MG tablet Commonly known as: CRESTOR Take 1 tablet by mouth once daily   Shingrix injection Generic drug: Zoster Vaccine Adjuvanted 0.5 ml in muscle and repeat in 8 weeks         OBJECTIVE:   Vital Signs: There were no vitals taken for this visit.   Wt Readings from Last 3 Encounters:  10/11/20 182 lb (82.6 kg)  08/21/20 180 lb 8 oz (81.9 kg)  07/19/20 179 lb 4 oz (81.3 kg)     Exam: General: Pt appears well and is in NAD  Lungs: Clear with good BS bilat with no rales, rhonchi, or wheezes  Heart: RRR with normal S1 and S2 and no gallops; no murmurs; no rub  Abdomen: Normoactive bowel sounds, soft, nontender, without masses or organomegaly palpable  Extremities: No pretibial edema.  Neuro: MS is good with appropriate affect, pt is alert and Ox3     DM foot exam: 11/15/2019   The skin of the feet is intact without sores or ulcerations. The pedal pulses are 2+ on right and 2+ on left. The sensation is intact to a screening 5.07, 10 gram monofilament bilaterally   DATA REVIEWED:  Lab Results  Component Value Date   HGBA1C 9.1 (A) 10/11/2020   HGBA1C 10.0 (A) 05/17/2020   HGBA1C 11.8 (A) 01/27/2020   Lab Results  Component Value Date   MICROALBUR 0.8 10/31/2019   LDLCALC 166 (H) 10/31/2019   CREATININE 0.62 08/21/2020   Lab Results  Component Value Date   MICRALBCREAT 9 10/31/2019     Lab Results  Component Value Date   CHOL 238 (H) 10/31/2019   HDL 39 (L) 10/31/2019   LDLCALC 166 (H) 10/31/2019   TRIG 180 (H) 10/31/2019   CHOLHDL 6.1 (H) 10/31/2019       Results for Megan Megan [name], Megan Megan [name] (MRN [id number]) as of 10/12/2020 16:30  Ref. Range 05/17/2020  08:09  ISLET CELL ANTIBODY SCREEN Latest Ref Range: NEGATIVE  NEGATIVE  Results for Megan Megan [name], Megan [name] (MRN [id number]) as of 10/12/2020 16:30  Ref. Range 05/17/2020 08:09  Glutamic Acid Decarb Ab Latest Ref Range: <5 IU/mL <5    ASSESSMENT / PLAN / RECOMMENDATIONS:   1) Type 2 Diabetes Mellitus, Poorly controlled, With out complications - Most recent A1c of 9.1 %. Goal A1c < 7.0 %.    -Despite the improvement in her A1c, the patient  continues with hyperglycemia and dietary indiscretions.  We did discuss again the importance of low-carb diet, and the importance of avoiding snacks as much as possible, and if she chooses to eat a snack she needs to avoid cereal and high starchy options. - GAD-65 and Islet cell Ab have come back negative -We will consider SGLT2 inhibitors in the future    MEDICATIONS: - Continue Janumet 50-1084m BID -Continue Basaglar 26  units daily  -Increase Novolog to 16 units with each meal  -CF: NovoLog (BG -130/20)  EDUCATION / INSTRUCTIONS: BG monitoring instructions: Patient is instructed to check her blood sugars 3 times a day, before meals  Call [facility] if: BG persistently < 70  I reviewed the Rule of 15 for the treatment of hypoglycemia in detail with the patient. Literature supplied.    2) Diabetic complications:  Eye: Does not have known diabetic retinopathy.  Neuro/ Feet: Does not have known diabetic peripheral neuropathy .  Renal: Patient does not have known baseline CKD. She   is not on an ACEI/ARB at present.        F/U in 4 months   Signed electronically by: AMack Guise MD  [facility]  [facility] [address], [place]  [address]Phone: [phone]: [phone]  CC: JMartinique Betty G, [id number]: [phone] Fax: [phone] Return to Endocrinology clinic as below: Future Appointments  Date Time  Provider [place] 02/11/2021  8:50 AM Melissa Pulido, IMelanie Crazier MD [facility] None  03/22/2021  3:50 PM [facility] MM 2 [facility]

## 2021-03-22 ENCOUNTER — Ambulatory Visit: Payer: Medicaid Other

## 2021-04-14 ENCOUNTER — Telehealth: Payer: BC Managed Care – PPO | Admitting: Emergency Medicine

## 2021-04-14 DIAGNOSIS — J208 Acute bronchitis due to other specified organisms: Secondary | ICD-10-CM | POA: Diagnosis not present

## 2021-04-14 MED ORDER — PREDNISONE 10 MG (21) PO TBPK: ORAL_TABLET | Freq: Every day | ORAL | 0 refills | Status: DC

## 2021-04-14 MED ORDER — BENZONATATE 100 MG PO CAPS: 100.0000 mg | ORAL_CAPSULE | Freq: Two times a day (BID) | ORAL | 0 refills | Status: DC | PRN

## 2021-04-14 NOTE — Progress Notes (Signed)
Virtual Visit Consent   Megan [name], you are scheduled for a virtual visit with a [facility] provider today.     Just as with appointments in the office, your consent must be obtained to participate.  Your consent will be active for this visit and any virtual visit you may have with one of our providers in the next 365 days.     If you have a MyChart account, a copy of this consent can be sent to you electronically.  All virtual visits are billed to your insurance company just like a traditional visit in the office.    As this is a virtual visit, video technology does not allow for your provider to perform a traditional examination.  This may limit your provider's ability to fully assess your condition.  If your provider identifies any concerns that need to be evaluated in person or the need to arrange testing (such as labs, EKG, etc.), we will make arrangements to do so.     Although advances in technology are sophisticated, we cannot ensure that it will always work on either your end or our end.  If the connection with a video visit is poor, the visit may have to be switched to a telephone visit.  With either a video or telephone visit, we are not always able to ensure that we have a secure connection.     I need to obtain your verbal consent now.   Are you willing to proceed with your visit today?    Megan [name] has provided verbal consent on 04/14/2021 for a virtual visit (video or telephone).   Lestine Box, [place]   Date: 04/14/2021 6:07 PM   Virtual Visit via Video Note   I, Lestine Box, connected with  Megan [name]  ([id number], February 13, 1971) on 04/14/21 at  6:00 PM EST by a video-enabled telemedicine application and verified that I am speaking with the correct person using two identifiers.  Location: Patient: Virtual Visit Location Patient: Home Provider: Virtual Visit Location Provider: Home Office   I discussed the limitations of evaluation  and management by telemedicine and the availability of in person appointments. The patient expressed understanding and agreed to proceed.    History of Present Illness: Megan [name] is a 51 y.o. who identifies as a female who was assigned female at birth, and is being seen today for sore throat, congestion, and productive cough x 4 days.  Denies sick exposure to COVID, flu or strep.  Reports negative at home covid test. Has tried OTC medications without relief.  Denies aggravating factors.  Reports previous symptoms in the past bronchitis.   Denies smoking.  Denies fever, chills, fatigue, SOB, wheezing, chest pain, nausea, changes in bowel or bladder habits.    ROS: As per HPI.  All other pertinent ROS negative.     HPI: HPI  Problems:  Patient Active Problem List   Diagnosis Date Noted   Type 2 diabetes mellitus with hyperglycemia, without long-term current use of insulin ([place]) 11/16/2019   Dyslipidemia 11/16/2019   Ulcerative colitis ([place]) 10/31/2019   Hypothyroidism (acquired) 10/27/2018   Hypertension, essential, benign 10/27/2018   Type 2 diabetes mellitus with other specified complication ([place]) 18/84/1660   Hyperlipidemia associated with type 2 diabetes mellitus ([place]) 10/27/2018    Allergies: No Known Allergies Medications:  Current Outpatient Medications:    benzonatate (TESSALON) 100 MG capsule, Take 1 capsule (100 mg total) by mouth 2 (two) times daily as needed for cough.,  Disp: 20 capsule, Rfl: 0   predniSONE (STERAPRED UNI-PAK 21 TAB) 10 MG (21) TBPK tablet, Take by mouth daily. Take 6 tabs by mouth daily  for 2 days, then 5 tabs for 2 days, then 4 tabs for 2 days, then 3 tabs for 2 days, 2 tabs for 2 days, then 1 tab by mouth daily for 2 days, Disp: 42 tablet, Rfl: 0   BD PEN NEEDLE NANO 2ND GEN 32G X 4 MM MISC, USE TO INJECT INSULIN IN THE MORNING, AT NOON, AND IN THE EVENING AND AT BEDTIME., Disp: , Rfl:    Continuous Blood Gluc Receiver (DEXCOM G6 RECEIVER) DEVI, 1  Device by Does not apply route as directed., Disp: 1 each, Rfl: 0   Continuous Blood Gluc Sensor (DEXCOM G6 SENSOR) MISC, USE AS DIRECTED, Disp: 3 each, Rfl: 11   Continuous Blood Gluc Transmit (DEXCOM G6 TRANSMITTER) MISC, 1 Device by Does not apply route as directed., Disp: 1 each, Rfl: 3   enalapril (VASOTEC) 10 MG tablet, Take 1 tablet by mouth once daily, Disp: 90 tablet, Rfl: 3   erythromycin ophthalmic ointment, Place 1 application into the right eye at bedtime., Disp: 3.5 g, Rfl: 0   insulin aspart (NOVOLOG FLEXPEN) 100 UNIT/ML FlexPen, Max daily 80 units, Disp: 75 mL, Rfl: 3   Insulin Glargine (BASAGLAR KWIKPEN) 100 UNIT/ML, Inject 32 Units into the skin daily., Disp: 30 mL, Rfl: 3   JANUMET 50-1000 MG tablet, TAKE 1 TABLET BY MOUTH TWICE DAILY WITH A MEAL, Disp: 120 tablet, Rfl: 0   levothyroxine (EUTHYROX) 125 MCG tablet, 1/2 tab Tuesdays and Thursdays.1 tab rest of days., Disp: 90 tablet, Rfl: 2   omeprazole (PRILOSEC) 20 MG capsule, TAKE 1 CAPSULE BY MOUTH EVERY DAY BEFORE BREAKFAST, Disp: 30 capsule, Rfl: 1   rosuvastatin (CRESTOR) 20 MG tablet, Take 1 tablet by mouth once daily, Disp: 90 tablet, Rfl: 3   Zoster Vaccine Adjuvanted (SHINGRIX) injection, 0.5 ml in muscle and repeat in 8 weeks, Disp: 0.5 mL, Rfl: 1  Observations/Objective: Patient is well-developed, well-nourished in no acute distress.  Resting comfortably at home. Mildly fatigued appearing Head is normocephalic, atraumatic.  No labored breathing. Speaking in full sentences and tolerating own secretion Speech is clear and coherent with logical content.  Patient is alert and oriented at baseline.    Assessment and Plan: 1. Viral bronchitis  Get plenty of rest and push fluids Prescribed tessalon perles Prescribed prednisone taper Use zyrtec for nasal congestion, runny nose, and/or sore throat Use flonase for nasal congestion and runny nose Use medications daily for symptom relief Use OTC medications like  ibuprofen or tylenol as needed fever or pain Follow up with PCP  Call or go to the ED if you have any new or worsening symptoms such as fever, worsening cough, shortness of breath, chest tightness, chest pain, turning blue, changes in mental status, etc...    Follow Up Instructions: I discussed the assessment and treatment plan with the patient. The patient was provided an opportunity to ask questions and all were answered. The patient agreed with the plan and demonstrated an understanding of the instructions.  A copy of instructions were sent to the patient via MyChart unless otherwise noted below.    The patient was advised to call back or seek an in-person evaluation if the symptoms worsen or if the condition fails to improve as anticipated.  Time:  I spent 5-10 minutes with the patient via telehealth technology discussing the above problems/concerns.  Lestine Box, PA-C

## 2021-04-14 NOTE — Patient Instructions (Signed)
Maurie Boettcher, thank you for joining Lestine Box, PA-C for today's virtual visit.  While this provider is not your primary care provider (PCP), if your PCP is located in our provider database this encounter information will be shared with them immediately following your visit.  Consent: (Patient) Maurie Boettcher provided verbal consent for this virtual visit at the beginning of the encounter.  Current Medications:  Current Outpatient Medications:    benzonatate (TESSALON) 100 MG capsule, Take 1 capsule (100 mg total) by mouth 2 (two) times daily as needed for cough., Disp: 20 capsule, Rfl: 0   predniSONE (STERAPRED UNI-PAK 21 TAB) 10 MG (21) TBPK tablet, Take by mouth daily. Take 6 tabs by mouth daily  for 2 days, then 5 tabs for 2 days, then 4 tabs for 2 days, then 3 tabs for 2 days, 2 tabs for 2 days, then 1 tab by mouth daily for 2 days, Disp: 42 tablet, Rfl: 0   BD PEN NEEDLE NANO 2ND GEN 32G X 4 MM MISC, USE TO INJECT INSULIN IN THE MORNING, AT NOON, AND IN THE EVENING AND AT BEDTIME., Disp: , Rfl:    Continuous Blood Gluc Receiver (DEXCOM G6 RECEIVER) DEVI, 1 Device by Does not apply route as directed., Disp: 1 each, Rfl: 0   Continuous Blood Gluc Sensor (DEXCOM G6 SENSOR) MISC, USE AS DIRECTED, Disp: 3 each, Rfl: 11   Continuous Blood Gluc Transmit (DEXCOM G6 TRANSMITTER) MISC, 1 Device by Does not apply route as directed., Disp: 1 each, Rfl: 3   enalapril (VASOTEC) 10 MG tablet, Take 1 tablet by mouth once daily, Disp: 90 tablet, Rfl: 3   erythromycin ophthalmic ointment, Place 1 application into the right eye at bedtime., Disp: 3.5 g, Rfl: 0   insulin aspart (NOVOLOG FLEXPEN) 100 UNIT/ML FlexPen, Max daily 80 units, Disp: 75 mL, Rfl: 3   Insulin Glargine (BASAGLAR KWIKPEN) 100 UNIT/ML, Inject 32 Units into the skin daily., Disp: 30 mL, Rfl: 3   JANUMET 50-1000 MG tablet, TAKE 1 TABLET BY MOUTH TWICE DAILY WITH A MEAL, Disp: 120 tablet, Rfl: 0   levothyroxine  (EUTHYROX) 125 MCG tablet, 1/2 tab Tuesdays and Thursdays.1 tab rest of days., Disp: 90 tablet, Rfl: 2   omeprazole (PRILOSEC) 20 MG capsule, TAKE 1 CAPSULE BY MOUTH EVERY DAY BEFORE BREAKFAST, Disp: 30 capsule, Rfl: 1   rosuvastatin (CRESTOR) 20 MG tablet, Take 1 tablet by mouth once daily, Disp: 90 tablet, Rfl: 3   Zoster Vaccine Adjuvanted (SHINGRIX) injection, 0.5 ml in muscle and repeat in 8 weeks, Disp: 0.5 mL, Rfl: 1   Medications ordered in this encounter:  Meds ordered this encounter  Medications   predniSONE (STERAPRED UNI-PAK 21 TAB) 10 MG (21) TBPK tablet    Sig: Take by mouth daily. Take 6 tabs by mouth daily  for 2 days, then 5 tabs for 2 days, then 4 tabs for 2 days, then 3 tabs for 2 days, 2 tabs for 2 days, then 1 tab by mouth daily for 2 days    Dispense:  42 tablet    Refill:  0    Order Specific Question:   Supervising Provider    Answer:   Sabra Heck, BRIAN [[id number]]   benzonatate (TESSALON) 100 MG capsule    Sig: Take 1 capsule (100 mg total) by mouth 2 (two) times daily as needed for cough.    Dispense:  20 capsule    Refill:  0    Order Specific Question:   Supervising  Provider    Answer:   Noemi Chapel [[id number]]     *If you need refills on other medications prior to your next appointment, please contact your pharmacy*  Follow-Up: Call back or seek an in-person evaluation if the symptoms worsen or if the condition fails to improve as anticipated.  Other Instructions Get plenty of rest and push fluids Prescribed tessalon perles Prescribed prednisone taper Use zyrtec for nasal congestion, runny nose, and/or sore throat Use flonase for nasal congestion and runny nose Use medications daily for symptom relief Use OTC medications like ibuprofen or tylenol as needed fever or pain Follow up with PCP  Call or go to the ED if you have any new or worsening symptoms such as fever, worsening cough, shortness of breath, chest tightness, chest pain, turning blue, changes in mental  status, etc...     If you have been instructed to have an in-person evaluation today at a local Urgent Care facility, please use the link below. It will take you to a list of all of our available [facility], including address, phone number and hours of operation. Please do not delay care.  [facility]  If you or a family member do not have a primary care provider, use the link below to schedule a visit and establish care. When you choose a [facility] primary care physician or advanced practice provider, you gain a long-term partner in health. Find a Primary Care Provider  Learn more about [facility]'s in-office and virtual care options: [facility]

## 2021-04-16 ENCOUNTER — Telehealth: Payer: Self-pay | Admitting: Pharmacy Technician

## 2021-04-16 ENCOUNTER — Encounter: Payer: Self-pay | Admitting: Family Medicine

## 2021-04-16 NOTE — Telephone Encounter (Signed)
Patient Advocate Encounter  Received notification from [facility]) that prior authorization for [place] is required.   PA submitted on 1.31.23 TRANSMITTER [place] Status is pending   [facility] will continue to follow  Luciano Cutter, CPhT Patient Advocate [facility] Phone: [phone] Fax:  [phone]

## 2021-04-17 ENCOUNTER — Other Ambulatory Visit (HOSPITAL_COMMUNITY): Payer: Self-pay

## 2021-04-17 NOTE — Telephone Encounter (Addendum)
Patient Advocate Encounter  Prior Authorization for [employer] has been approved.    PA# [id number]   Effective dates: 04/16/21 through 04/16/22  RTS, next fill is 06/24/21  Spoke with Pharmacy to Process.  Patient Advocate Fax: [phone]

## 2021-04-18 ENCOUNTER — Encounter: Payer: Self-pay | Admitting: Family Medicine

## 2021-04-18 ENCOUNTER — Telehealth: Payer: Self-pay | Admitting: Family Medicine

## 2021-04-18 ENCOUNTER — Ambulatory Visit (INDEPENDENT_AMBULATORY_CARE_PROVIDER_SITE_OTHER): Payer: BC Managed Care – PPO | Admitting: Family Medicine

## 2021-04-18 VITALS — BP 140/80 | HR 105 | Temp 98.4°F | Wt 185.2 lb

## 2021-04-18 DIAGNOSIS — J069 Acute upper respiratory infection, unspecified: Secondary | ICD-10-CM | POA: Diagnosis not present

## 2021-04-18 DIAGNOSIS — I10 Essential (primary) hypertension: Secondary | ICD-10-CM

## 2021-04-18 NOTE — Telephone Encounter (Signed)
Patient calling in with respiratory symptoms: Shortness of breath, chest pain, palpitations or other red words send to Triage  Does the patient have a fever over 100, cough, congestion, sore throat, runny nose, lost of taste/smell (please list symptoms that patient has)?cough  What date did symptoms start?04-11-2021 (If over 5 days ago, pt may be scheduled for in person visit)  Have you tested for Covid in the last 5 days? Yes   If yes, was it positive []  OR negative [x] ? If positive in the last 5 days, please schedule virtual visit now. If negative, schedule for an in person OV with the next available provider if PCP has no openings. Please also let patient know they will be tested again (follow the script below)  "you will have to arrive 40mns prior to your appt time to be Covid tested. Please park in back of office at the [facility] & call [phone] let the staff know you have arrived. A staff member will meet you at your car to do a rapid covid test. Once the test has resulted you will be notified by phone of your results to determine if appt will remain an in person visit or be converted to a virtual/phone visit. If you arrive less than 310ms before your appt time, your visit will be automatically converted to virtual & any recommended testing will happen AFTER the visit." Pt has an appt with dr banks 04-18-2021 2 pm  THINGS TO REMEMBER  If no availability for virtual visit in office,  please schedule another [facility]  If no availability at another [facility], please instruct patient that they can schedule an evisit or virtual visit through their mychart account. Visits up to 8pm  patients can be seen in office 5 days after positive COVID test

## 2021-04-18 NOTE — Progress Notes (Signed)
Subjective:    Patient ID: Megan [name], female    DOB: 21-Jul-1970, 51 y.o.   MRN: [id number]  Chief Complaint  Patient presents with   Cough    X 1 week, sore throat, cough, nasal congestion, wheezing    HPI Patient was seen today for ongoing acute concern.  Pt seen virtually on 04/14/21.  Given prednisone taper and Tessalon for viral bronchitis.  Pt states her energy has improved in the last few days, but she is still coughing.  Pt noticed L eye had matting/drainage this am.  Having coughing spells at night.  When laying flat feels increased drainage in throat.  Used nebulizer early this am.  Unsure if it helped as she went back to sleep after the treatment.  Pt using OTC sugar free cough gtts, netti pot, saline nasal rinse.  Past Medical History:  Diagnosis Date   Diabetes mellitus without complication (HCC)    Hyperlipidemia    Hypertension    Metabolic syndrome    Ulcerative colitis ([place])    Ulcerative colitis, acute ([place])     No Known Allergies  ROS General: Denies fever, chills, night sweats, changes in weight, changes in appetite HEENT: Denies headaches, ear pain, changes in vision + rhinorrhea, sore throat eye drainage CV: Denies CP, palpitations, SOB, orthopnea Pulm: Denies SOB, cough, wheezing + cough, wheezing GI: Denies abdominal pain, nausea, vomiting, diarrhea, constipation GU: Denies dysuria, hematuria, frequency, vaginal discharge Msk: Denies muscle cramps, joint pains Neuro: Denies weakness, numbness, tingling Skin: Denies rashes, bruising Psych: Denies depression, anxiety, hallucinations     Objective:    Blood pressure 140/80, pulse (!) 105, temperature 98.4 F (36.9 C), temperature source Oral, weight 185 lb 3.2 oz (84 kg), SpO2 99 %.  Gen. Pleasant, well-nourished, in no distress, normal affect   HEENT: [place]/AT, face symmetric, conjunctiva clear, no scleral icterus, PERRLA, EOMI, nares patent without drainage, pharynx without erythema or  exudate.  TMs full bilaterally.  No cervical lymphadenopathy. Lungs: Intermittent dry cough, no accessory muscle use, CTAB, no wheezes or rales Cardiovascular: Tachycardic, no m/r/g, no peripheral edema Musculoskeletal: No deformities, no cyanosis or clubbing, normal tone Neuro:  A&Ox3, CN II-XII intact, normal gait Skin:  Warm, no lesions/ rash   Wt Readings from Last 3 Encounters:  04/18/21 185 lb 3.2 oz (84 kg)  10/11/20 182 lb (82.6 kg)  08/21/20 180 lb 8 oz (81.9 kg)    Lab Results  Component Value Date   WBC 5.6 05/09/2020   HGB 14.6 05/09/2020   HCT 42.4 05/09/2020   PLT 408.0 (H) 05/09/2020   GLUCOSE 199 (H) 08/21/2020   CHOL 238 (H) 10/31/2019   TRIG 180 (H) 10/31/2019   HDL 39 (L) 10/31/2019   LDLCALC 166 (H) 10/31/2019   ALT 32 (H) 10/31/2019   AST 16 10/31/2019   NA 133 (L) 08/21/2020   K 4.0 08/21/2020   CL 99 08/21/2020   CREATININE 0.62 08/21/2020   BUN 15 08/21/2020   CO2 25 08/21/2020   TSH 0.12 (L) 08/21/2020   HGBA1C 9.1 (A) 10/11/2020   MICROALBUR 0.8 10/31/2019    Assessment/Plan:  Viral URI with cough -COVID testing negative. -continue prednisone taper and nebulizer prn -reassured as lungs clear on exam.  Post-nasal drainage likely increased at night causing increasing coughing. -OTC antihistamine, OTC cough and cold medication sugar-free and HBP versions. -Continue supportive care including steam from shower, saline nasal rinse, Nettie pot, rest, hydration likely duration of symptoms -Given precautions.  Essential hypertension -  Uncontrolled -Possibly 2/2 increased coughing and OTC cold medication -Patient encouraged to use cough medicine without decongestant -Continue current medications including enalapril 10 mg daily.  F/u prn continue to worsen symptoms  Grier Mitts, MD

## 2021-04-24 ENCOUNTER — Other Ambulatory Visit: Payer: Self-pay

## 2021-04-24 ENCOUNTER — Encounter: Payer: Self-pay | Admitting: Family Medicine

## 2021-04-24 ENCOUNTER — Ambulatory Visit: Payer: Self-pay

## 2021-04-24 ENCOUNTER — Ambulatory Visit (INDEPENDENT_AMBULATORY_CARE_PROVIDER_SITE_OTHER): Payer: BC Managed Care – PPO

## 2021-04-24 ENCOUNTER — Telehealth (INDEPENDENT_AMBULATORY_CARE_PROVIDER_SITE_OTHER): Payer: BC Managed Care – PPO | Admitting: Family Medicine

## 2021-04-24 VITALS — BP 130/80 | HR 108 | Temp 97.5°F | Resp 16 | Ht 69.0 in | Wt 185.0 lb

## 2021-04-24 DIAGNOSIS — R051 Acute cough: Secondary | ICD-10-CM

## 2021-04-24 DIAGNOSIS — I10 Essential (primary) hypertension: Secondary | ICD-10-CM | POA: Diagnosis not present

## 2021-04-24 DIAGNOSIS — E785 Hyperlipidemia, unspecified: Secondary | ICD-10-CM

## 2021-04-24 DIAGNOSIS — E039 Hypothyroidism, unspecified: Secondary | ICD-10-CM

## 2021-04-24 DIAGNOSIS — E1169 Type 2 diabetes mellitus with other specified complication: Secondary | ICD-10-CM

## 2021-04-24 MED ORDER — LEVOTHYROXINE SODIUM 125 MCG PO TABS: ORAL_TABLET | ORAL | 0 refills | Status: DC

## 2021-04-24 MED ORDER — ENALAPRIL MALEATE 10 MG PO TABS: 10.0000 mg | ORAL_TABLET | Freq: Every day | ORAL | 3 refills | Status: DC

## 2021-04-24 MED ORDER — BENZONATATE 100 MG PO CAPS: 100.0000 mg | ORAL_CAPSULE | Freq: Two times a day (BID) | ORAL | 0 refills | Status: DC | PRN

## 2021-04-24 MED ORDER — LEVOTHYROXINE SODIUM 125 MCG PO TABS: ORAL_TABLET | ORAL | 2 refills | Status: DC

## 2021-04-24 MED ORDER — ALBUTEROL SULFATE HFA 108 (90 BASE) MCG/ACT IN AERS: 2.0000 | INHALATION_SPRAY | Freq: Four times a day (QID) | RESPIRATORY_TRACT | 0 refills | Status: DC | PRN

## 2021-04-24 MED ORDER — HYDROCODONE BIT-HOMATROP MBR 5-1.5 MG/5ML PO SOLN: 5.0000 mL | Freq: Two times a day (BID) | ORAL | 0 refills | Status: AC | PRN

## 2021-04-24 MED ORDER — ROSUVASTATIN CALCIUM 20 MG PO TABS: 20.0000 mg | ORAL_TABLET | Freq: Every day | ORAL | 3 refills | Status: DC

## 2021-04-24 NOTE — Patient Instructions (Signed)
A few things to remember from today's visit:   Acute cough - Plan: DG Chest 2 View, DG Chest 2 View, DG Chest 2 View  Hypertension, essential, benign - Plan: enalapril (VASOTEC) 10 MG tablet  If you need refills please call your pharmacy. Do not use My Chart to request refills or for acute issues that need immediate attention.    Please be sure medication list is accurate. If a new problem present, please set up appointment sooner than planned today. It seems due to recent viral illness. Plain mucinex. No decongestants or cold meds. If persistent we may need to adjust GERD med or change for a different blood pressure med.

## 2021-04-24 NOTE — Progress Notes (Signed)
ACUTE VISIT Chief Complaint  Patient presents with   Cough    Pt c/o cough that she has had for 2 weeks now and the cough has kept up her during the night.   medication refill    Pt would like all medications refilled also.   HPI: [name] is a 51 y.o. female, who is here today complaining of productive cough as described above. Problem has been going on for 2 weeks.  Wheezing, mainly at bedtime. No fever,chills,sore throat,CP,SOB, or palpitations. No hx of asthma or tobacco use.  Cough The current episode started 1 to 4 weeks ago. The problem has been unchanged. The cough is Productive of sputum. Associated symptoms include nasal congestion, postnasal drip, rhinorrhea and wheezing. Pertinent negatives include no chest pain, chills, ear congestion, ear pain, fever, headaches, heartburn, hemoptysis, myalgias, rash, sore throat, shortness of breath, sweats or weight loss. The symptoms are aggravated by lying down. The treatment provided no relief. Her past medical history is significant for environmental allergies. There is no history of asthma or emphysema.  She is not checking BP at home.  She needs refills on Crestor 20 mg. She takes medication daily, no side effects.  Lab Results  Component Value Date   CHOL 238 (H) 10/31/2019   HDL 39 (L) 10/31/2019   LDLCALC 166 (H) 10/31/2019   TRIG 180 (H) 10/31/2019   CHOLHDL 6.1 (H) 10/31/2019   HTN on Enalapril 10 mg daily. Lab Results  Component Value Date   CREATININE 0.62 08/21/2020   BUN 15 08/21/2020   NA 133 (L) 08/21/2020   K 4.0 08/21/2020   CL 99 08/21/2020   CO2 25 08/21/2020   Review of Systems  Constitutional:  Negative for chills, fever and weight loss.  HENT:  Positive for postnasal drip and rhinorrhea. Negative for ear pain and sore throat.   Respiratory:  Positive for cough and wheezing. Negative for hemoptysis and shortness of breath.   Cardiovascular:  Negative for chest pain.   Gastrointestinal:  Negative for abdominal pain, heartburn and vomiting.  Musculoskeletal:  Negative for myalgias.  Skin:  Negative for rash.  Allergic/Immunologic: Positive for environmental allergies.  Neurological:  Negative for headaches.  Rest see pertinent positives and negatives per HPI.  Current Outpatient Medications on File Prior to Visit  Medication Sig Dispense Refill   BD PEN NEEDLE NANO 2ND GEN 32G X 4 MM MISC USE TO INJECT INSULIN IN THE MORNING, AT NOON, AND IN THE EVENING AND AT BEDTIME.     Continuous Blood Gluc Receiver (DEXCOM G6 RECEIVER) DEVI 1 Device by Does not apply route as directed. 1 each 0   Continuous Blood Gluc Sensor (DEXCOM G6 SENSOR) MISC USE AS DIRECTED 3 each 11   Continuous Blood Gluc Transmit (DEXCOM G6 TRANSMITTER) MISC 1 Device by Does not apply route as directed. 1 each 3   erythromycin ophthalmic ointment Place 1 application into the right eye at bedtime. 3.5 g 0   insulin aspart (NOVOLOG FLEXPEN) 100 UNIT/ML FlexPen Max daily 80 units 75 mL 3   Insulin Glargine (BASAGLAR KWIKPEN) 100 UNIT/ML Inject 32 Units into the skin daily. 30 mL 3   JANUMET 50-1000 MG tablet TAKE 1 TABLET BY MOUTH TWICE DAILY WITH A MEAL 120 tablet 0   omeprazole (PRILOSEC) 20 MG capsule TAKE 1 CAPSULE BY MOUTH EVERY DAY BEFORE BREAKFAST 30 capsule 1   Zoster Vaccine Adjuvanted [facility]) injection 0.5 ml in muscle and repeat in 8 weeks 0.5 mL  1   No current facility-administered medications on file prior to visit.   Past Medical History:  Diagnosis Date   Diabetes mellitus without complication ([place])    Hyperlipidemia    Hypertension    Metabolic syndrome    Ulcerative colitis ([place])    Ulcerative colitis, acute ([place])    No Known Allergies  Social History   Socioeconomic History   Marital status: Single    Spouse name: Not on file   Number of children: 1   Years of education: Not on file   Highest education level: Not on file  Occupational History   Not on file   Tobacco Use   Smoking status: Never   Smokeless tobacco: Never  Vaping Use   Vaping Use: Never used  Substance and Sexual Activity   Alcohol use: Yes    Comment: occasionally    Drug use: No   Sexual activity: Yes    Partners: Male    Comment: 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64, PARTNERS- 6, 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   Other Topics Concern   Not on file  Social History Narrative   Not on file   Social Determinants of Health   Financial Resource Strain: Not on file  Food Insecurity: Not on file  Transportation Needs: Not on file  Physical Activity: Not on file  Stress: Not on file  Social Connections: Not on file    Vitals:   04/24/21 1402  BP: 130/80  Pulse: (!) 108  Resp: 16  Temp: (!) 97.5 F (36.4 C)  SpO2: 97%   Body mass index is 27.32 kg/m.  Physical Exam Vitals and nursing note reviewed.  Constitutional:      General: She is not in acute distress.    Appearance: She is well-developed. She is not ill-appearing.  HENT:     Head: Normocephalic and atraumatic.     Right Ear: Tympanic membrane, ear canal and external ear normal.     Left Ear: Tympanic membrane, ear canal and external ear normal.     Nose: Rhinorrhea present.     Right Turbinates: Enlarged.     Left Turbinates: Enlarged.     Mouth/Throat:     Mouth: Mucous membranes are moist.     Pharynx: Oropharynx is clear.  Eyes:     Conjunctiva/sclera: Conjunctivae normal.  Cardiovascular:     Rate and Rhythm: Regular rhythm. Tachycardia present.     Heart sounds: No murmur heard. Pulmonary:     Effort: Pulmonary effort is normal. No respiratory distress.     Breath sounds: Normal breath sounds. No stridor.  Musculoskeletal:     Cervical back: No edema or erythema. No muscular tenderness.  Lymphadenopathy:     Head:     Right side of head: No submandibular adenopathy.     Left side of head: No submandibular adenopathy.     Cervical: No cervical adenopathy.  Skin:    General: Skin is warm.     Findings:  No erythema or rash.  Neurological:     Mental Status: She is alert and oriented to person, place, and time.     Gait: Gait normal.  Psychiatric:     Comments: Well groomed, good eye contact.   ASSESSMENT AND PLAN:  Megan [name] was seen today for cough and medication refill.  Diagnoses and all orders for this visit: Orders Placed This Encounter  Procedures   DG Chest 2 View   Acute cough We discussed possible causes. Explained that cough and congestion  can last a few more days and even weeks after acute URI symptoms have resolved. Lung auscultation negative today. CXR ordered. Benzonatate and hycodan for cough management. Adequate hydration and plain mucinex may also help. RAD?, recommend Albuterol inh 2 puff every 6 hours for a week then as needed for wheezing or shortness of breath.  ACEI can aggravate problem, if not better in a few weeks, we may need to consider changing to a different antihypertensive medication. She is not having heartburn,for  continue same omeprazole dose. Instructed about warning signs.  -     benzonatate (TESSALON) 100 MG capsule; Take 1 capsule (100 mg total) by mouth 2 (two) times daily as needed for cough. -     HYDROcodone bit-homatropine (HYCODAN) 5-1.5 MG/5ML syrup; Take 5 mLs by mouth every 12 (twelve) hours as needed for up to 10 days for cough. -     albuterol (VENTOLIN HFA) 108 (90 Base) MCG/ACT inhaler; Inhale 2 puffs into the lungs every 6 (six) hours as needed for wheezing or shortness of breath.  Hypertension, essential, benign BP initially elevated at 142/80, re-checked and improved. Continue Enalapril same dose and low salt diet. Monitor BP at home.  -     enalapril (VASOTEC) 10 MG tablet; Take 1 tablet (10 mg total) by mouth daily.  Hyperlipidemia associated with type 2 diabetes mellitus ([place]) She is not fasting today, we will plan on checking FLP next f/u visit. Continue Crestor 20 mg daily and low fat diet.  -     rosuvastatin  (CRESTOR) 20 MG tablet; Take 1 tablet (20 mg total) by mouth daily.  Return if symptoms worsen or fail to improve, for keep next appt.  Jullian Previti G. [place], MD  [facility]. [place] office.

## 2021-05-09 ENCOUNTER — Ambulatory Visit: Payer: BC Managed Care – PPO | Admitting: Radiology

## 2021-05-09 ENCOUNTER — Other Ambulatory Visit: Payer: Self-pay

## 2021-05-09 ENCOUNTER — Encounter: Payer: Self-pay | Admitting: Radiology

## 2021-05-09 VITALS — BP 134/82

## 2021-05-09 DIAGNOSIS — N760 Acute vaginitis: Secondary | ICD-10-CM | POA: Diagnosis not present

## 2021-05-09 LAB — WET PREP FOR TRICH, YEAST, CLUE

## 2021-05-09 MED ORDER — FLUCONAZOLE 150 MG PO TABS: 150.0000 mg | ORAL_TABLET | Freq: Once | ORAL | 4 refills | Status: AC

## 2021-05-09 MED ORDER — TERCONAZOLE 0.4 % VA CREA: 1.0000 | TOPICAL_CREAM | Freq: Every day | VAGINAL | 1 refills | Status: AC

## 2021-05-09 NOTE — Progress Notes (Signed)
° ° ° ° °  Vaginal discharge   Subjective: c/o vaginal discharge, itching. Hx of chronic yeast infections. Reports her blood sugars have ben high lately. Used monistat 1 day 2 days ago which burned. Desires STI screen: no, not sexually active   Objective:  -Vulva: without lesions or discharge -Vagina: thick, white discharge present,  wet prep obtained -Cervix: no lesion or discharge, no CMT -Perineum: no lesions -Uterus: Mobile, non tender -Adnexa: no masses or tenderness  Microscopic wet-mount exam shows unable to interpret due to monistat present in the vagina.   Chaperone offered and declined.  Assessment:  -Vaginitis, presumptive  Plan: Diflucan and terazol with refills. Blood sugar control. Avoid the use of soaps or perfumed products in the peri area. Avoid tub baths and sitting in sweaty or wet clothing for prolonged periods of time.

## 2021-05-16 ENCOUNTER — Other Ambulatory Visit: Payer: Self-pay | Admitting: Family Medicine

## 2021-05-21 ENCOUNTER — Encounter: Payer: Self-pay | Admitting: Internal Medicine

## 2021-05-22 ENCOUNTER — Other Ambulatory Visit: Payer: Self-pay | Admitting: Internal Medicine

## 2021-05-22 MED ORDER — DEXCOM G7 SENSOR MISC: 1.0000 | 3 refills | Status: DC

## 2021-05-28 ENCOUNTER — Ambulatory Visit: Payer: BC Managed Care – PPO | Admitting: Nurse Practitioner

## 2021-05-28 DIAGNOSIS — Z0289 Encounter for other administrative examinations: Secondary | ICD-10-CM

## 2021-05-28 NOTE — Progress Notes (Deleted)
? ?  Megan [name] October 09, 1970 [id number] ? ? ?History:  51 y.o. [id number] presents for annual exam. Normal pap and mammogram history. T2DM managed by endocrinology. History of recurrent yeast infections due to uncontrolled diabetes. Treated for yeast infection 05/09/2021. History of HTN, HLD, UC.  ? ?Gynecologic History ?No LMP recorded. ?  ?Contraception: condoms ?Sexually active: *** ? ?Health maintenance ?Last Pap: 02/15/2020. Results were: Normal ?Last mammogram: 01/05/2020. Results were: Normal ?Last colonoscopy: Never ?Last Dexa: Not indicated ? ?Past medical history, past surgical history, family history and social history were all reviewed and documented in the EPIC chart. ? ?ROS:  A ROS was performed and pertinent positives and negatives are included. ? ?Exam: ? ?There were no vitals filed for this visit. ? ?There is no height or weight on file to calculate BMI. ? ?General appearance:  Normal ?Thyroid:  Symmetrical, normal in size, without palpable masses or nodularity. ?Respiratory ? Auscultation:  Clear without wheezing or rhonchi ?Cardiovascular ? Auscultation:  Regular rate, without rubs, murmurs or gallops ? Edema/varicosities:  Not grossly evident ?Abdominal ? Soft,nontender, without masses, guarding or rebound. ? Liver/spleen:  No organomegaly noted ? Hernia:  None appreciated ? Skin ? Inspection:  Grossly normal ?  ?Breasts: Examined lying and sitting.  ? Right: Without masses, retractions, discharge or axillary adenopathy. ? ? Left: Without masses, retractions, discharge or axillary adenopathy. ?Genitourinary  ? Inguinal/mons:  Normal without inguinal adenopathy ? External genitalia:  Normal appearing vulva with no masses, tenderness, or lesions ? BUS/Urethra/Skene's glands:  Normal ? Vagina:  Normal appearing with normal color and discharge, no lesions ? Cervix:  Normal appearing without discharge or lesions ? Uterus:  Normal in size, shape and contour.  Midline and mobile,  nontender ? Adnexa/parametria:   ?  Rt: Normal in size, without masses or tenderness. ?  Lt: Normal in size, without masses or tenderness. ? Anus and perineum: Normal ? Digital rectal exam: Normal sphincter tone without palpated masses or tenderness ? ?Patient informed chaperone available to be present for breast and pelvic exam. Patient has requested no chaperone to be present. Patient has been advised what will be completed during breast and pelvic exam.  ? ?Assessment/Plan:  51 y.o. [id number] for annual exam. ? ? ? ?Screening for cervical cancer - Normal pap history.  Discussed current guidelines.  She would like to have Paps annually.  Pap with reflex done today. ? ?Screening for breast cancer -Normal mammogram history. Overdue. Normal breast exam today. ? ?Screening for colon cancer -  ? ?Follow-up in 1 year for annual. ? ? ? ? ? ? ?Tamela Gammon [facility], 9:34 AM 05/28/2021 ? ?

## 2021-06-13 ENCOUNTER — Other Ambulatory Visit: Payer: Self-pay | Admitting: Internal Medicine

## 2021-06-17 MED ORDER — JANUMET 50-1000 MG PO TABS: 1.0000 | ORAL_TABLET | Freq: Two times a day (BID) | ORAL | 0 refills | Status: DC

## 2021-07-12 ENCOUNTER — Other Ambulatory Visit: Payer: Self-pay | Admitting: Family Medicine

## 2021-07-20 ENCOUNTER — Ambulatory Visit
Admission: RE | Admit: 2021-07-20 | Discharge: 2021-07-20 | Disposition: A | Discharge status: Home / Self Care | Payer: BC Managed Care – PPO | Source: Ambulatory Visit | Attending: Family Medicine | Admitting: Family Medicine

## 2021-07-20 DIAGNOSIS — Z1231 Encounter for screening mammogram for malignant neoplasm of breast: Secondary | ICD-10-CM

## 2021-07-21 ENCOUNTER — Other Ambulatory Visit: Payer: Self-pay | Admitting: Internal Medicine

## 2021-07-21 DIAGNOSIS — E1169 Type 2 diabetes mellitus with other specified complication: Secondary | ICD-10-CM

## 2021-07-22 ENCOUNTER — Encounter: Payer: Self-pay | Admitting: Internal Medicine

## 2021-07-22 ENCOUNTER — Other Ambulatory Visit: Payer: Self-pay | Admitting: Internal Medicine

## 2021-07-22 DIAGNOSIS — E1169 Type 2 diabetes mellitus with other specified complication: Secondary | ICD-10-CM

## 2021-07-23 ENCOUNTER — Other Ambulatory Visit: Payer: Self-pay

## 2021-07-23 MED ORDER — BASAGLAR KWIKPEN 100 UNIT/ML ~~LOC~~ SOPN: 32.0000 [IU] | PEN_INJECTOR | Freq: Every day | SUBCUTANEOUS | 0 refills | Status: DC

## 2021-07-26 ENCOUNTER — Ambulatory Visit (INDEPENDENT_AMBULATORY_CARE_PROVIDER_SITE_OTHER): Payer: BC Managed Care – PPO | Admitting: Internal Medicine

## 2021-07-26 ENCOUNTER — Encounter: Payer: Self-pay | Admitting: Internal Medicine

## 2021-07-26 VITALS — BP 126/74 | HR 83 | Ht 69.0 in | Wt 189.0 lb

## 2021-07-26 DIAGNOSIS — E1165 Type 2 diabetes mellitus with hyperglycemia: Secondary | ICD-10-CM

## 2021-07-26 DIAGNOSIS — R739 Hyperglycemia, unspecified: Secondary | ICD-10-CM

## 2021-07-26 LAB — POCT GLYCOSYLATED HEMOGLOBIN (HGB A1C): Hemoglobin A1C: 11.3 % — AB (ref 4.0–5.6)

## 2021-07-26 LAB — POCT GLUCOSE (DEVICE FOR HOME USE): Glucose Fasting, POC: 329 mg/dL — AB (ref 70–99)

## 2021-07-26 MED ORDER — METFORMIN HCL ER 750 MG PO TB24: 750.0000 mg | ORAL_TABLET | Freq: Two times a day (BID) | ORAL | 3 refills | Status: AC

## 2021-07-26 MED ORDER — BASAGLAR KWIKPEN 100 UNIT/ML ~~LOC~~ SOPN: 40.0000 [IU] | PEN_INJECTOR | Freq: Every day | SUBCUTANEOUS | 3 refills | Status: DC

## 2021-07-26 MED ORDER — INSULIN PEN NEEDLE 30G X 5 MM MISC: 1.0000 | Freq: Four times a day (QID) | 3 refills | Status: AC

## 2021-07-26 MED ORDER — OZEMPIC (0.25 OR 0.5 MG/DOSE) 2 MG/1.5ML ~~LOC~~ SOPN: 0.5000 mg | PEN_INJECTOR | SUBCUTANEOUS | 1 refills | Status: AC

## 2021-07-26 MED ORDER — NOVOLOG FLEXPEN 100 UNIT/ML ~~LOC~~ SOPN
PEN_INJECTOR | SUBCUTANEOUS | 3 refills | Status: DC
Start: 2021-07-26 — End: 2021-08-29

## 2021-07-26 NOTE — Progress Notes (Signed)
?Name: Megan [name]  ?Age/ Sex: 50 y.o., female   ?MRN/ DOB: [id number], [birth year]    ? ?PCP: Martinique, Betty G, MD   ?Reason for Endocrinology Evaluation: Type 2 Diabetes Mellitus  ?Initial Endocrine Consultative Visit: 11/15/2019  ? ? ?PATIENT IDENTIFIER: Megan [name] is a 51 y.o. female with a past medical history of T2Dm and UC. The patient has followed with Endocrinology clinic since 11/15/2019 for consultative assistance with management of her diabetes. ? ?DIABETIC HISTORY:  ?Ms. Funches was diagnosed with DM in her 13's.Ozempic - persistent hyperglycemia . Janumet started 10/2019 . Her hemoglobin A1c has ranged from 10.7% in 2020, peaking at 12.9% in 2021 ? ?On her initial visit to our clinic she had an A1c of 12.9% . She had just been started on Janumet . We started basal insulin  ? ? ?Prandial insulin started 07/2020 ? ? ?SUBJECTIVE:  ? ?During the last visit (10/11/2020): A1c 9.1 %. Continue Janumet and increased  insulin and started prandial insulin  ? ? ? ? ?Today (07/26/2021): Ms. Cirrincione is here for a follow up on diabetes management. She has NOT been here in 10 months.   She has not been able to use dexcom due to insurance issues until recently when she started on G7 but having malfunction issues.  ? ? ?She avoids sugar- sweetened beverages  ?She is psychologically bothered by weight gain around abdominal area  ? ? ?HOME DIABETES REGIMEN:  ?Janumet 50-1000 mg BID ?Basaglar  32 units daily  ?Novolog 16 units TIDQAC ?CF: NovoLog (BG -130/20) ?  ? ? ? ?Statin: yes ?ACE-I/ARB: yes ? ? ? ? ?CONTINUOUS GLUCOSE MONITORING RECORD INTERPRETATION : no data   ? ? ? ? ? ?DIABETIC COMPLICATIONS: ?Microvascular complications:  ? ?Denies: CKD, retinopathy , neuropathy  ?Last Eye Exam: Completed  ? ?Macrovascular complications:  ? ?Denies: CAD, CVA, PVD ? ? ?HISTORY:  ?Past Medical History:  ?Past Medical History:  ?Diagnosis Date  ? Diabetes mellitus without complication ([place])   ? Hyperlipidemia    ? Hypertension   ? Metabolic syndrome   ? Ulcerative colitis ([place])   ? Ulcerative colitis, acute ([place])   ? ?Past Surgical History:  ?Past Surgical History:  ?Procedure Laterality Date  ? EYE SURGERY    ? ?Social History:  reports that she has never smoked. She has never used smokeless tobacco. She reports current alcohol use. She reports that she does not use drugs. ?Family History:  ?Family History  ?Problem Relation Age of Onset  ? Asthma Mother   ? COPD Mother   ? Hypertension Mother   ? Cancer Maternal Grandfather   ?     LUNG  ? Diabetes Maternal Grandfather   ? Cancer Paternal Grandmother   ?     UTERINE  ? Breast cancer Neg Hx   ? ? ? ?HOME MEDICATIONS: ?Allergies as of 07/26/2021   ?No Known Allergies ?  ? ?  ?Medication List  ?  ? ?  ? Accurate as of Jul 26, 2021 12:30 PM. If you have any questions, ask your nurse or doctor.  ?  ?  ? ?  ? ?albuterol 108 (90 Base) MCG/ACT inhaler ?Commonly known as: VENTOLIN HFA ?INHALE 2 PUFFS INTO THE LUNGS EVERY 6 HOURS AS NEEDED FOR WHEEZING OR SHORTNESS OF BREATH ?  ?Basaglar KwikPen 100 UNIT/ML ?Inject 32 Units into the skin daily. ?  ?BD Pen Needle Nano 2nd Gen 32G X 4 MM Misc ?Generic drug: Insulin Pen Needle ?  USE TO INJECT INSULIN EVERY MORNING, EVERY NOON, EVERY EVENING AND EVERY NIGHT AT BEDTIME ?  ?benzonatate 100 MG capsule ?Commonly known as: TESSALON ?Take 1 capsule (100 mg total) by mouth 2 (two) times daily as needed for cough. ?  ?Dexcom G6 Receiver Devi ?1 Device by Does not apply route as directed. ?  ?Dexcom G6 Transmitter Misc ?1 Device by Does not apply route as directed. ?  ?Dexcom G7 Sensor Misc ?1 Device by Does not apply route as directed. Every 10 days ?  ?enalapril 10 MG tablet ?Commonly known as: VASOTEC ?Take 1 tablet (10 mg total) by mouth daily. ?  ?erythromycin ophthalmic ointment ?Place 1 application into the right eye at bedtime. ?  ?Janumet 50-1000 MG tablet ?Generic drug: sitaGLIPtin-metformin ?Take 1 tablet by mouth 2 (two) times daily  with a meal. ?  ?levothyroxine 125 MCG tablet ?Commonly known as: Euthyrox ?1/2 tab Tuesdays and Thursdays.1 tab rest of days. ?  ?NovoLOG FlexPen 100 UNIT/ML FlexPen ?Generic drug: insulin aspart ?Max daily 80 units ?  ?omeprazole 20 MG capsule ?Commonly known as: PRILOSEC ?TAKE 1 CAPSULE BY MOUTH EVERY DAY BEFORE BREAKFAST ?  ?rosuvastatin 20 MG tablet ?Commonly known as: CRESTOR ?TAKE 1 TABLET BY MOUTH DAILY ?  ?Shingrix injection ?Generic drug: Zoster Vaccine Adjuvanted ?0.5 ml in muscle and repeat in 8 weeks ?  ?terconazole 0.4 % vaginal cream ?Commonly known as: TERAZOL 7 ?Place 1 applicator vaginally at bedtime. ?  ? ?  ? ? ? ?OBJECTIVE:  ? ?Vital Signs: BP 126/74 (BP Location: Left Arm, Patient Position: Sitting, Cuff Size: Small)   Pulse 83   Ht 5' 9"  (1.753 m)   Wt 189 lb (85.7 kg)   LMP 07/12/2021 (Approximate)   SpO2 99%   BMI 27.91 kg/m?  ?  ?Wt Readings from Last 3 Encounters:  ?07/26/21 189 lb (85.7 kg)  ?04/24/21 185 lb (83.9 kg)  ?04/18/21 185 lb 3.2 oz (84 kg)  ? ? ? ?Exam: ?General: Pt appears well and is in NAD  ?Lungs: Clear with good BS bilat with no rales, rhonchi, or wheezes  ?Heart: RRR with normal S1 and S2 and no gallops; no murmurs; no rub  ?Abdomen: Normoactive bowel sounds, soft, nontender, without masses or organomegaly palpable  ?Extremities: No pretibial edema.  ?Neuro: MS is good with appropriate affect, pt is alert and Ox3  ? ? ? ? ?DATA REVIEWED: ? ?Lab Results  ?Component Value Date  ? HGBA1C 9.1 (A) 10/11/2020  ? HGBA1C 10.0 (A) 05/17/2020  ? HGBA1C 11.8 (A) 01/27/2020  ? ?Lab Results  ?Component Value Date  ? MICROALBUR 0.8 10/31/2019  ? LDLCALC 166 (H) 10/31/2019  ? CREATININE 0.62 08/21/2020  ? ?Lab Results  ?Component Value Date  ? MICRALBCREAT 9 10/31/2019  ? ? ? ?Lab Results  ?Component Value Date  ? CHOL 238 (H) 10/31/2019  ? HDL 39 (L) 10/31/2019  ? LDLCALC 166 (H) 10/31/2019  ? TRIG 180 (H) 10/31/2019  ? CHOLHDL 6.1 (H) 10/31/2019  ?     ?Results for DORNA, MALLET (MRN [id number]) as of 10/12/2020 16:30 ? Ref. Range 05/17/2020 08:09  ?ISLET CELL ANTIBODY SCREEN Latest Ref Range: NEGATIVE  NEGATIVE  ?Results for LESHAY, DESAULNIERS (MRN [id number]) as of 10/12/2020 16:30 ? Ref. Range 05/17/2020 08:09  ?Glutamic Acid Decarb Ab Latest Ref Range: <5 IU/mL <5  ? ? ?ASSESSMENT / PLAN / RECOMMENDATIONS:  ? ?1) Type 2 Diabetes Mellitus, Poorly controlled, With out complications - Most recent  A1c of 11.3 %. Goal A1c < 7.0 %.   ? ?-The patient has not been here in approximately 10 months, discussed importance of regular office visits and making adjustments as necessary ?- GAD-65 and Islet cell Ab have come back negative ?-Historically she had declined weight loss medication but today she has become bothered by abdominal weight gain and is willing to try Ozempic ?-We will stop Janumet, patient understands she cannot take Ozempic and Januvia at the same time ?-I am going to increase her insulin as below ? ? ? ?MEDICATIONS: ?-Stop Janumet ?-Start metformin 750 mg XR twice daily ?-Start Ozempic 0.25 mg once weekly for 6 weeks then increase to 0.5 mg weekly ?-Increase Basaglar 40  units daily  ?-Continue Novolog  16 units with each meal  ?-CF: NovoLog (BG -130/20) ? ?EDUCATION / INSTRUCTIONS: ?BG monitoring instructions: Patient is instructed to check her blood sugars 3 times a day, before meals  ?Call [facility] if: BG persistently < 70  ?I reviewed the Rule of 15 for the treatment of hypoglycemia in detail with the patient. Literature supplied. ? ? ? ?2) Diabetic complications:  ?Eye: Does not have known diabetic retinopathy.  ?Neuro/ Feet: Does not have known diabetic peripheral neuropathy .  ?Renal: Patient does not have known baseline CKD. She   is not on an ACEI/ARB at present.  ?  ? ?  ? ?F/U in 4 months ? ? ?Signed electronically by: ?Abby Nena Jordan, MD ? ?[facility]  ?[facility] ?[address] ?[place], [place] [address] ?Phone: [phone] ?FAX: [phone] ? ? ?CC: ?Martinique, Betty G, MD ?[address] ?[place] [address] ?Phone: [phone]  ?Fax: [phone] ? ?Return to Endocrinology clinic as b

## 2021-07-26 NOTE — Patient Instructions (Addendum)
-   Stop Janumet  ?- Start Metformin 750 mg twice daily  ?- Start Ozempic 0.25 mg once weekly for 6 weeks, than increase to 0.5 mg weekly  ?- Increase  Basaglar 40  units daily  ?- Continue Novolog 16 units with each meal  ?- Novolog correctional insulin: ADD extra units on insulin to your meal-time Novolog  dose if your blood sugars are higher than 150. Use the scale below to help guide you:  ? ?Blood sugar before meal Number of units to inject  ?Less than 150 0 unit  ?151 -  170 1 units  ?171 - 190 2 units  ?191 - 210 3 units  ?211 - 230 4 units  ?231 - 260 5 units  ?261 - 280 6 units  ?281 - 300 7 units  ?301 - 320 8 units  ?321 - 340 9 units   ?341 - 360 10 units   ? ? ?Check out insulin pumps ( Tandem and Omnipod ) ? ? ?HOW TO TREAT LOW BLOOD SUGARS (Blood sugar LESS THAN 70 MG/DL) ?Please follow the RULE OF 15 for the treatment of hypoglycemia treatment (when your (blood sugars are less than 70 mg/dL)  ? ?STEP 1: Take 15 grams of carbohydrates when your blood sugar is low, which includes:  ?3-4 GLUCOSE TABS  OR ?3-4 OZ OF JUICE OR REGULAR SODA OR ?ONE TUBE OF GLUCOSE GEL   ? ?STEP 2: RECHECK blood sugar in 15 MINUTES ?STEP 3: If your blood sugar is still low at the 15 minute recheck --> then, go back to STEP 1 and treat AGAIN with another 15 grams of carbohydrates. ? ?

## 2021-07-26 NOTE — Progress Notes (Deleted)
Fol

## 2021-07-29 ENCOUNTER — Encounter: Payer: Self-pay | Admitting: Internal Medicine

## 2021-08-15 ENCOUNTER — Other Ambulatory Visit: Payer: Self-pay | Admitting: Internal Medicine

## 2021-08-28 ENCOUNTER — Encounter: Payer: Self-pay | Admitting: Internal Medicine

## 2021-08-28 ENCOUNTER — Other Ambulatory Visit: Payer: Self-pay | Admitting: Internal Medicine

## 2021-08-29 ENCOUNTER — Other Ambulatory Visit: Payer: Self-pay

## 2021-08-29 MED ORDER — NOVOLOG FLEXPEN 100 UNIT/ML ~~LOC~~ SOPN: PEN_INJECTOR | SUBCUTANEOUS | 3 refills | Status: DC

## 2021-08-29 MED ORDER — DEXCOM G6 TRANSMITTER MISC: 1.0000 | 3 refills | Status: AC

## 2021-09-03 ENCOUNTER — Telehealth: Payer: BC Managed Care – PPO | Admitting: Physician Assistant

## 2021-09-03 DIAGNOSIS — T63481A Toxic effect of venom of other arthropod, accidental (unintentional), initial encounter: Secondary | ICD-10-CM

## 2021-09-04 ENCOUNTER — Encounter (HOSPITAL_COMMUNITY): Payer: Self-pay

## 2021-09-04 ENCOUNTER — Other Ambulatory Visit: Payer: Self-pay | Admitting: Internal Medicine

## 2021-09-04 ENCOUNTER — Ambulatory Visit (HOSPITAL_COMMUNITY)
Admission: RE | Admit: 2021-09-04 | Discharge: 2021-09-04 | Disposition: A | Discharge status: Home / Self Care | Payer: BC Managed Care – PPO | Source: Ambulatory Visit | Attending: Family Medicine | Admitting: Family Medicine

## 2021-09-04 VITALS — BP 138/89 | HR 88 | Temp 98.3°F | Resp 16

## 2021-09-04 DIAGNOSIS — T7840XA Allergy, unspecified, initial encounter: Secondary | ICD-10-CM

## 2021-09-04 DIAGNOSIS — W57XXXA Bitten or stung by nonvenomous insect and other nonvenomous arthropods, initial encounter: Secondary | ICD-10-CM | POA: Diagnosis not present

## 2021-09-04 DIAGNOSIS — S60862A Insect bite (nonvenomous) of left wrist, initial encounter: Secondary | ICD-10-CM | POA: Diagnosis not present

## 2021-09-04 MED ORDER — PREDNISONE 10 MG PO TABS: ORAL_TABLET | ORAL | 0 refills | Status: AC

## 2021-09-04 MED ORDER — HYDROXYZINE PAMOATE 25 MG PO CAPS: 25.0000 mg | ORAL_CAPSULE | Freq: Three times a day (TID) | ORAL | 0 refills | Status: AC | PRN

## 2021-09-04 NOTE — Discharge Instructions (Signed)
As we discussed, I have sent a prescription to the pharmacy for a steroid.  This can increase your blood sugars and can also make you hyper.  Take it early in the day and take it with food.  Monitor your sugars closely and contact your endocrinologist if they are consistently elevated.  If you are not making improvement over the next 1 to 2 days, you should be seen again, as it is possible that this could become infected.  If you develop fevers, especially if the redness is spreading significantly, please be seen right away.  You can use ice and instead of the Benadryl, take the hydroxyzine that was prescribed to you.

## 2021-09-04 NOTE — Progress Notes (Signed)
E-Visit for Insect Sting  Thank you for describing the insect sting for [place].  Here is how we plan to help!  A sting that we will treat with a short course of prednisone.  The 2 greatest risks from insect stings are allergic reaction, which can be fatal in some people and infection, which is more common and less serious.  Bees, wasps, yellow jackets, and hornets belong to a class of insects called Hymenoptera.  Most insect stings cause only minor discomfort.  Stings can happen anywhere on the body and can be painful.  Most stings are from honey bees or yellow jackets.  Fire ants can sting multiple times.  The sites of the stings are more likely to become infected.    Based on your information I have:, Provided a home care guide for insect stings and instructions on when to call for help., and I have sent in Hydroxyzine to take as directed for itch and swelling. Apply cold compresses to the area. If not improving or anything worsening, you need to follow-up with your PCP as steroids may be needed but giving your significantly elevated A1C they will have to monitor things closely.   Please make sure that you selected a pharmacy that is open now.  What can be used to prevent Insect Stings?  Insect repellant with at least 20% DEET.  Wearing long pants and shirts with socks and shoes.  Wear dark or drab-colored clothes rather than bright colors.  Avoid using perfumes and hair sprays; these attract insects.  HOME CARE ADVICE:  1. Stinger removal: The stinger looks like a tiny black dot in the sting. Use a fingernail, credit card edge, or knife-edge to scrape it off.  Don't pull it out because it squeezes out more venom. If the stinger is below the skin surface, leave it alone.  It will be shed with normal skin healing. 2. Use cold compresses to the area of the sting for 10-20 minutes.  You may repeat this as needed to relieve symptoms of pain and swelling. 3.  For pain relief, take acetominophen  650 mg 4-6 hours as needed or ibuprofen 400 mg every 6-8 hours as needed or naproxen 250-500 mg every 12 hours as needed. 4.  You can also use hydrocortisone cream 0.5% or 1% up to 4 times daily as needed for itching. 5.  If the sting becomes very itchy, take Benadryl 25-50 mg, follow directions on box. 6.  Wash the area 2-3 times daily with antibacterial soap and warm water. 7. Call your Doctor if: Fever, a severe headache, or rash occur in the next 2 weeks. Sting area begins to look infected. Redness and swelling worsens after home treatment. Your current symptoms become worse.    MAKE SURE YOU:  Understand these instructions. Will watch your condition. Will get help right away if you are not doing well or get worse.  Thank you for choosing an e-visit.  Your e-visit answers were reviewed by a board certified advanced clinical practitioner to complete your personal care plan. Depending upon the condition, your plan could have included both over the counter or prescription medications.  Please review your pharmacy choice. Make sure the pharmacy is open so you can pick up prescription now. If there is a problem, you may contact your provider through [employer] and have the prescription routed to another pharmacy.  Your safety is important to [place]. If you have drug allergies check your prescription carefully.   For the next  24 hours you can use MyChart to ask questions about today's visit, request a non-urgent call back, or ask for a work or school excuse. You will get an email in the next two days asking about your experience. I hope that your e-visit has been valuable and will speed your recovery.

## 2021-09-04 NOTE — ED Provider Notes (Signed)
[facility]    CSN: [id number] Arrival date & time: 09/04/21  1003      History   Chief Complaint Chief Complaint  Patient presents with   Insect Bite    HPI Megan [name] is a 51 y.o. female.   Left Wrist Swelling Stung by what she thinks was a wasp 3 days ago Pain and swelling has worsened Also having some redness No fevers, otherwise feeling well Pain and itching makes it difficult to sleep No difficulty breathing or prior allergic reaction that she is aware of Has tried benadryl and other OTC medications without improvement  Has DM, dexcom today shows CBG 187     Past Medical History:  Diagnosis Date   Diabetes mellitus without complication ([place])    Hyperlipidemia    Hypertension    Metabolic syndrome    Ulcerative colitis ([place])    Ulcerative colitis, acute [facility])     Patient Active Problem List   Diagnosis Date Noted   Type 2 diabetes mellitus with hyperglycemia, without long-term current use of insulin ([place]) 11/16/2019   Dyslipidemia 11/16/2019   Ulcerative colitis ([place]) 10/31/2019   Hypothyroidism (acquired) 10/27/2018   Hypertension, essential, benign 10/27/2018   Type 2 diabetes mellitus with other specified complication ([place]) 91/79/1505   Hyperlipidemia associated with type 2 diabetes mellitus ([place]) 10/27/2018    Past Surgical History:  Procedure Laterality Date   EYE SURGERY      OB History     Gravida  3   Para  1   Term      Preterm      AB  2   Living  1      SAB      IAB      Ectopic      Multiple      Live Births               Home Medications    Prior to Admission medications   Medication Sig Start Date End Date Taking? Authorizing Provider  enalapril (VASOTEC) 10 MG tablet Take 1 tablet (10 mg total) by mouth daily. 04/24/21  Yes Martinique, Betty G, MD  hydrOXYzine (VISTARIL) 25 MG capsule Take 1 capsule (25 mg total) by mouth every 8 (eight) hours as needed. 09/04/21  Yes Brunetta Jeans, PA-C  insulin aspart (NOVOLOG FLEXPEN) 100 UNIT/ML FlexPen Max daily 80 units 08/29/21  Yes Shamleffer, Melanie Crazier, MD  levothyroxine (EUTHYROX) 125 MCG tablet 1/2 tab Tuesdays and Thursdays.1 tab rest of days. 04/24/21  Yes [place], Betty G, MD  metFORMIN (GLUCOPHAGE-XR) 750 MG 24 hr tablet Take 1 tablet (750 mg total) by mouth in the morning and at bedtime. 07/26/21  Yes Shamleffer, Melanie Crazier, MD  omeprazole (PRILOSEC) 20 MG capsule TAKE 1 CAPSULE BY MOUTH EVERY DAY BEFORE BREAKFAST 11/14/20  Yes Martinique, Betty G, MD  predniSONE (DELTASONE) 10 MG tablet Take 6 tablets (60 mg total) by mouth daily for 1 day, THEN 5 tablets (50 mg total) daily for 1 day, THEN 4 tablets (40 mg total) daily for 1 day, THEN 3 tablets (30 mg total) daily for 1 day, THEN 2 tablets (20 mg total) daily for 1 day, THEN 1 tablet (10 mg total) daily for 1 day. 09/04/21 09/10/21 Yes Tennyson Kallen, Bernita Raisin, DO  rosuvastatin (CRESTOR) 20 MG tablet TAKE 1 TABLET BY MOUTH DAILY 07/12/21  Yes [place], Betty G, MD  Semaglutide,0.25 or 0.5MG/DOS, (OZEMPIC, 0.25 OR 0.5 MG/DOSE,) 2 MG/1.5ML SOPN Inject 0.5  mg into the skin once a week. 07/26/21  Yes Shamleffer, Melanie Crazier, MD  albuterol (VENTOLIN HFA) 108 (90 Base) MCG/ACT inhaler INHALE 2 PUFFS INTO THE LUNGS EVERY 6 HOURS AS NEEDED FOR WHEEZING OR SHORTNESS OF BREATH 05/16/21   [place], Betty G, MD  benzonatate (TESSALON) 100 MG capsule Take 1 capsule (100 mg total) by mouth 2 (two) times daily as needed for cough. Patient not taking: Reported on 07/26/2021 04/24/21   [place], Betty G, MD  Continuous Blood Gluc Receiver (DEXCOM G6 RECEIVER) DEVI USE AS DIRECTED 08/28/21   Shamleffer, Melanie Crazier, MD  Continuous Blood Gluc Sensor (DEXCOM G7 SENSOR) MISC 1 Device by Does not apply route as directed. Every 10 days 05/22/21   Shamleffer, Melanie Crazier, MD  Continuous Blood Gluc Transmit (DEXCOM G6 TRANSMITTER) MISC 1 Device by Does not apply route as directed. 08/29/21   Shamleffer, Melanie Crazier, MD  erythromycin ophthalmic ointment Place 1 application into the right eye at bedtime. Patient not taking: Reported on 07/26/2021 09/18/20   [name], DO  Insulin Glargine [facility]) 100 UNIT/ML ADMINISTER 32 UNITS UNDER THE SKIN DAILY 09/04/21   Shamleffer, Melanie Crazier, MD  Insulin Pen Needle 30G X 5 MM MISC 1 Device by Does not apply route in the morning, at noon, in the evening, and at bedtime. 07/26/21   Shamleffer, Melanie Crazier, MD  terconazole (TERAZOL 7) 0.4 % vaginal cream Place 1 applicator vaginally at bedtime. 05/09/21   Rubbie Battiest B, NP  Zoster Vaccine Adjuvanted [facility]) injection 0.5 ml in muscle and repeat in 8 weeks 05/09/20   [place], Betty G, MD    Family History Family History  Problem Relation Age of Onset   Asthma Mother    COPD Mother    Hypertension Mother    Cancer Maternal Grandfather        LUNG   Diabetes Maternal Grandfather    Cancer Paternal Grandmother        UTERINE   Breast cancer Neg Hx     Social History Social History   Tobacco Use   Smoking status: Never   Smokeless tobacco: Never  Vaping Use   Vaping Use: Never used  Substance Use Topics   Alcohol use: Yes    Comment: occasionally    Drug use: No     Allergies   Patient has no known allergies.   Review of Systems Review of Systems  All other systems reviewed and are negative.  Per HPI  Physical Exam Triage Vital Signs ED Triage Vitals [09/04/21 1027]  Enc Vitals Group     BP 138/89     Pulse Rate 88     Resp 16     Temp 98.3 F (36.8 C)     Temp Source Oral     SpO2 98 %     Weight      Height      Head Circumference      Peak Flow      Pain Score      Pain Loc      Pain Edu?      Excl. in [place]?    No data found.  Updated Vital Signs BP 138/89 (BP Location: Right Arm)   Pulse 88   Temp 98.3 F (36.8 C) (Oral)   Resp 16   LMP 08/10/2021 (Exact Date)   SpO2 98%   Visual Acuity Right Eye Distance:   Left Eye Distance:    Bilateral Distance:  Right Eye Near:   Left Eye Near:    Bilateral Near:     Physical Exam Constitutional:      General: She is not in acute distress.    Appearance: Normal appearance. She is not ill-appearing.  HENT:     Head: Normocephalic and atraumatic.  Eyes:     Conjunctiva/sclera: Conjunctivae normal.  Cardiovascular:     Rate and Rhythm: Normal rate.  Pulmonary:     Effort: Pulmonary effort is normal. No respiratory distress.  Musculoskeletal:     Cervical back: Normal range of motion.     Comments: Left wrist with dorsal swelling and small papule in the center without stinger identified, there is erythema and swelling extending from wrist to lower 1/3 of forearm with slight increased warmth to palpation, full ROM of wrist and digits, NV intact distally  Skin:    General: Skin is warm and dry.  Neurological:     Mental Status: She is alert and oriented to person, place, and time.  Psychiatric:        Mood and Affect: Mood normal.        Behavior: Behavior normal.      UC Treatments / Results  Labs (all labs ordered are listed, but only abnormal results are displayed) Labs Reviewed - No data to display  EKG   Radiology No results found.  Procedures Procedures (including critical care time)  Medications Ordered in UC Medications - No data to display  Initial Impression / Assessment and Plan / UC Course  I have reviewed the triage vital signs and the nursing notes.  Pertinent labs & imaging results that were available during my care of the patient were reviewed by me and considered in my medical decision making (see chart for details).     No systemic symptoms.  No evidence of anaphylaxis.  Symptoms seem most consistent with a severe allergic reaction, will treat with prednisone.  She was already prescribed hydroxyzine from a virtual visit this morning that was not fully completed.  She can take this instead of Benadryl.  Also recommended icing.   Advised that if she is not making improvement over the next 1 to 2 days, that she needs to be seen again as there may be a concern for infection at that time.  Advise if she is having significant worsening, should also follow-up right away.  Discussed with monitoring her blood sugar while on prednisone.   Final Clinical Impressions(s) / UC Diagnoses   Final diagnoses:  Insect bite of left wrist, initial encounter  Allergic reaction, initial encounter     Discharge Instructions      As we discussed, I have sent a prescription to the pharmacy for a steroid.  This can increase your blood sugars and can also make you hyper.  Take it early in the day and take it with food.  Monitor your sugars closely and contact your endocrinologist if they are consistently elevated.  If you are not making improvement over the next 1 to 2 days, you should be seen again, as it is possible that this could become infected.  If you develop fevers, especially if the redness is spreading significantly, please be seen right away.  You can use ice and instead of the Benadryl, take the hydroxyzine that was prescribed to you.     ED Prescriptions     Medication Sig Dispense Auth. Provider   predniSONE (DELTASONE) 10 MG tablet Take 6 tablets (60 mg total) by mouth  daily for 1 day, THEN 5 tablets (50 mg total) daily for 1 day, THEN 4 tablets (40 mg total) daily for 1 day, THEN 3 tablets (30 mg total) daily for 1 day, THEN 2 tablets (20 mg total) daily for 1 day, THEN 1 tablet (10 mg total) daily for 1 day. 21 tablet Gwendolyne Welford, Bernita Raisin, DO      PDMP not reviewed this encounter.   Karliah Kowalchuk, Bernita Raisin, DO 09/04/21 1057

## 2021-09-04 NOTE — Progress Notes (Signed)
I have spent 5 minutes in review of e-visit questionnaire, review and updating patient chart, medical decision making and response to patient.   Marrell Dicaprio Cody Verna Desrocher, PA-C    

## 2021-09-04 NOTE — ED Triage Notes (Addendum)
Patient c/o LFT hand swelling  due to insect bite that started 3 days ago.   Patient states upon onset of symptoms " I felt something sting me and I think something bit me, its been getting worse".   Patient endorse redness, itching, burning.   Patient endorses increased pain when moving wrist.   Patient has used neosporin, cortisone, ibuprofen, and benadryl with no relief of symptoms.

## 2021-09-12 ENCOUNTER — Encounter: Payer: Self-pay | Admitting: Internal Medicine

## 2021-09-13 ENCOUNTER — Other Ambulatory Visit: Payer: Self-pay

## 2021-09-13 MED ORDER — DEXCOM G7 SENSOR MISC: 1.0000 | 3 refills | Status: AC

## 2021-10-06 ENCOUNTER — Other Ambulatory Visit: Payer: Self-pay | Admitting: Family Medicine

## 2021-10-06 DIAGNOSIS — E039 Hypothyroidism, unspecified: Secondary | ICD-10-CM

## 2021-10-15 ENCOUNTER — Telehealth: Payer: BC Managed Care – PPO | Admitting: Physician Assistant

## 2021-10-15 DIAGNOSIS — B9689 Other specified bacterial agents as the cause of diseases classified elsewhere: Secondary | ICD-10-CM

## 2021-10-15 DIAGNOSIS — H109 Unspecified conjunctivitis: Secondary | ICD-10-CM | POA: Diagnosis not present

## 2021-10-15 MED ORDER — POLYMYXIN B-TRIMETHOPRIM 10000-0.1 UNIT/ML-% OP SOLN: 1.0000 [drp] | OPHTHALMIC | 0 refills | Status: DC

## 2021-10-15 NOTE — Progress Notes (Signed)
E-Visit for [employer]   We are sorry that you are not feeling well.  Here is how we plan to help!  Based on what you have shared with me it looks like you have conjunctivitis.  Conjunctivitis is a common inflammatory or infectious condition of the eye that is often referred to as "pink eye".  In most cases it is contagious (viral or bacterial). However, not all conjunctivitis requires antibiotics (ex. Allergic).  We have made appropriate suggestions for you based upon your presentation.  I have prescribed Polytrim Ophthalmic drops 1-2 drops 4 times a day times 5 days  Pink eye can be highly contagious.  It is typically spread through direct contact with secretions, or contaminated objects or surfaces that one may have touched.  Strict handwashing is suggested with soap and water is urged.  If not available, use alcohol based had sanitizer.  Avoid unnecessary touching of the eye.  If you wear contact lenses, you will need to refrain from wearing them until you see no white discharge from the eye for at least 24 hours after being on medication.  You should see symptom improvement in 1-2 days after starting the medication regimen.  Call us if symptoms are not improved in 1-2 days.  Home Care: Wash your hands often! Do not wear your contacts until you complete your treatment plan. Avoid sharing towels, bed linen, personal items with a person who has pink eye. See attention for anyone in your home with similar symptoms.  Get Help Right Away If: Your symptoms do not improve. You develop blurred or loss of vision. Your symptoms worsen (increased discharge, pain or redness)   Thank you for choosing an e-visit.  Your e-visit answers were reviewed by a board certified advanced clinical practitioner to complete your personal care plan. Depending upon the condition, your plan could have included both over the counter or prescription medications.  Please review your pharmacy choice. Make sure the  pharmacy is open so you can pick up prescription now. If there is a problem, you may contact your provider through [employer] and have the prescription routed to another pharmacy.  Your safety is important to [place]. If you have drug allergies check your prescription carefully.   For the next 24 hours you can use MyChart to ask questions about today's visit, request a non-urgent call back, or ask for a work or school excuse. You will get an email in the next two days asking about your experience. I hope that your e-visit has been valuable and will speed your recovery.  I provided 5 minutes of non face-to-face time during this encounter for chart review and documentation.

## 2021-10-22 ENCOUNTER — Telehealth: Payer: BC Managed Care – PPO | Admitting: Family Medicine

## 2021-10-22 DIAGNOSIS — J019 Acute sinusitis, unspecified: Secondary | ICD-10-CM

## 2021-10-22 DIAGNOSIS — B9689 Other specified bacterial agents as the cause of diseases classified elsewhere: Secondary | ICD-10-CM

## 2021-10-22 MED ORDER — AMOXICILLIN-POT CLAVULANATE 875-125 MG PO TABS: 1.0000 | ORAL_TABLET | Freq: Two times a day (BID) | ORAL | 0 refills | Status: AC

## 2021-10-22 NOTE — Progress Notes (Signed)

## 2021-10-25 ENCOUNTER — Telehealth: Payer: BC Managed Care – PPO | Admitting: Family Medicine

## 2021-10-25 DIAGNOSIS — R051 Acute cough: Secondary | ICD-10-CM

## 2021-10-25 MED ORDER — PROMETHAZINE-DM 6.25-15 MG/5ML PO SYRP: 5.0000 mL | ORAL_SOLUTION | Freq: Four times a day (QID) | ORAL | 0 refills | Status: AC | PRN

## 2021-10-25 NOTE — Progress Notes (Signed)
We are sorry that you are not feeling well.  Here is how we plan to help!  Based on your presentation I believe you most likely have a cough associated with your sinusitis. I will send promethazine DM to use for cough. Be aware it may cause drowsiness so do not drive or perform certain activities while on this medication.   From your responses in the eVisit questionnaire you describe inflammation in the upper respiratory tract which is causing a significant cough.  This is commonly called Bronchitis and has four common causes:   Allergies Viral Infections Acid Reflux Bacterial Infection Allergies, viruses and acid reflux are treated by controlling symptoms or eliminating the cause. An example might be a cough caused by taking certain blood pressure medications. You stop the cough by changing the medication. Another example might be a cough caused by acid reflux. Controlling the reflux helps control the cough.  USE OF BRONCHODILATOR ("RESCUE") INHALERS: There is a risk from using your bronchodilator too frequently.  The risk is that over-reliance on a medication which only relaxes the muscles surrounding the breathing tubes can reduce the effectiveness of medications prescribed to reduce swelling and congestion of the tubes themselves.  Although you feel brief relief from the bronchodilator inhaler, your asthma may actually be worsening with the tubes becoming more swollen and filled with mucus.  This can delay other crucial treatments, such as oral steroid medications. If you need to use a bronchodilator inhaler daily, several times per day, you should discuss this with your provider.  There are probably better treatments that could be used to keep your asthma under control.     HOME CARE Only take medications as instructed by your medical team. Complete the entire course of an antibiotic. Drink plenty of fluids and get plenty of rest. Avoid close contacts especially the very young and the  elderly Cover your mouth if you cough or cough into your sleeve. Always remember to wash your hands A steam or ultrasonic humidifier can help congestion.   GET HELP RIGHT AWAY IF: You develop worsening fever. You become short of breath You cough up blood. Your symptoms persist after you have completed your treatment plan MAKE SURE YOU  Understand these instructions. Will watch your condition. Will get help right away if you are not doing well or get worse.    Thank you for choosing an e-visit.  Your e-visit answers were reviewed by a board certified advanced clinical practitioner to complete your personal care plan. Depending upon the condition, your plan could have included both over the counter or prescription medications.  Please review your pharmacy choice. Make sure the pharmacy is open so you can pick up prescription now. If there is a problem, you may contact your provider through [employer] and have the prescription routed to another pharmacy.  Your safety is important to [place]. If you have drug allergies check your prescription carefully.   For the next 24 hours you can use MyChart to ask questions about today's visit, request a non-urgent call back, or ask for a work or school excuse. You will get an email in the next two days asking about your experience. I hope that your e-visit has been valuable and will speed your recovery.   I have provided 5 minutes of non face to face time during this encounter for chart review and documentation.

## 2021-12-05 NOTE — Progress Notes (Deleted)
ACUTE VISIT No chief complaint on file.  HPI: Megan [name] is a 51 y.o. female, who is here today complaining of *** HPI  Review of Systems Rest see pertinent positives and negatives per HPI.  Current Outpatient Medications on File Prior to Visit  Medication Sig Dispense Refill  . albuterol (VENTOLIN HFA) 108 (90 Base) MCG/ACT inhaler INHALE 2 PUFFS INTO THE LUNGS EVERY 6 HOURS AS NEEDED FOR WHEEZING OR SHORTNESS OF BREATH 6.7 g 0  . Continuous Blood Gluc Receiver (DEXCOM G6 RECEIVER) DEVI USE AS DIRECTED 1 each 0  . Continuous Blood Gluc Sensor (DEXCOM G7 SENSOR) MISC 1 Device by Does not apply route as directed. Every 10 days 9 each 3  . Continuous Blood Gluc Transmit (DEXCOM G6 TRANSMITTER) MISC 1 Device by Does not apply route as directed. 1 each 3  . enalapril (VASOTEC) 10 MG tablet Take 1 tablet (10 mg total) by mouth daily. 90 tablet 3  . hydrOXYzine (VISTARIL) 25 MG capsule Take 1 capsule (25 mg total) by mouth every 8 (eight) hours as needed. 30 capsule 0  . insulin aspart (NOVOLOG FLEXPEN) 100 UNIT/ML FlexPen Max daily 80 units 75 mL 3  . Insulin Glargine (BASAGLAR KWIKPEN) 100 UNIT/ML ADMINISTER 32 UNITS UNDER THE SKIN DAILY 15 mL 2  . Insulin Pen Needle 30G X 5 MM MISC 1 Device by Does not apply route in the morning, at noon, in the evening, and at bedtime. 400 each 3  . levothyroxine (SYNTHROID) 125 MCG tablet TAKE 1/2 TABLET BY MOUTH ON TUESDAYS AND THURSDAYS, AND 1 TABLET REST OF DAYS. 90 tablet 0  . metFORMIN (GLUCOPHAGE-XR) 750 MG 24 hr tablet Take 1 tablet (750 mg total) by mouth in the morning and at bedtime. 180 tablet 3  . omeprazole (PRILOSEC) 20 MG capsule TAKE 1 CAPSULE BY MOUTH EVERY DAY BEFORE BREAKFAST 30 capsule 1  . rosuvastatin (CRESTOR) 20 MG tablet TAKE 1 TABLET BY MOUTH DAILY 90 tablet 3  . Semaglutide,0.25 or 0.5MG/DOS, (OZEMPIC, 0.25 OR 0.5 MG/DOSE,) 2 MG/1.5ML SOPN Inject 0.5 mg into the skin once a week. 4.5 mL 1  . terconazole  (TERAZOL 7) 0.4 % vaginal cream Place 1 applicator vaginally at bedtime. 45 g 1  . trimethoprim-polymyxin b (POLYTRIM) ophthalmic solution Place 1 drop into both eyes every 4 (four) hours. X 5 days 10 mL 0  . Zoster Vaccine Adjuvanted [facility]) injection 0.5 ml in muscle and repeat in 8 weeks 0.5 mL 1   No current facility-administered medications on file prior to visit.     Past Medical History:  Diagnosis Date  . Diabetes mellitus without complication ([place])   . Hyperlipidemia   . Hypertension   . Metabolic syndrome   . Ulcerative colitis ([place])   . Ulcerative colitis, acute ([place])    No Known Allergies  Social History   Socioeconomic History  . Marital status: Single    Spouse name: Not on file  . Number of children: 1  . Years of education: Not on file  . Highest education level: Not on file  Occupational History  . Not on file  Tobacco Use  . Smoking status: Never  . Smokeless tobacco: Never  Vaping Use  . Vaping Use: Never used  Substance and Sexual Activity  . Alcohol use: Yes    Comment: occasionally   . Drug use: No  . Sexual activity: Yes    Partners: Male    Comment: 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29, PARTNERS- 6, CURRENT PARTNER-  5 MONTHS   Other Topics Concern  . Not on file  Social History Narrative  . Not on file   Social Determinants of Health   Financial Resource Strain: Not on file  Food Insecurity: Not on file  Transportation Needs: Not on file  Physical Activity: Not on file  Stress: Not on file  Social Connections: Not on file    There were no vitals filed for this visit. There is no height or weight on file to calculate BMI.  Physical Exam  ASSESSMENT AND PLAN:  There are no diagnoses linked to this encounter.   No follow-ups on file.   Betty G. [place], MD  [facility]. [place] office.  Discharge Instructions   None

## 2021-12-06 ENCOUNTER — Ambulatory Visit: Payer: BC Managed Care – PPO | Admitting: Family Medicine

## 2021-12-06 NOTE — Progress Notes (Unsigned)
ACUTE VISIT Chief Complaint  Patient presents with   Shoulder Pain    Left shoulder x 3 weeks, radiates to arm. Denies any trauma.    HPI: [name] is a 51 y.o. female with medical hx significant for DM II, HTN,HLD,hypothyroidism, and UC here today complaining of posterior and lateral left shoulder pain radiated down LUE as described above. She has not noted edema,erythema, or rash.  Shoulder Pain  The pain is present in the left shoulder, left arm, left elbow, left wrist, left hand and left fingers. This is a recurrent problem. The current episode started 1 to 4 weeks ago. There has been no history of extremity trauma. The problem occurs constantly. The problem has been gradually worsening. The pain is moderate. Associated symptoms include numbness and tingling. Pertinent negatives include no fever or itching. The symptoms are aggravated by activity. She has tried nothing for the symptoms. The treatment provided moderate relief. Family history does not include gout or rheumatoid arthritis. Her past medical history is significant for diabetes. There is no history of rheumatoid arthritis.  Similar symptoms in 08/2020. Typing exacerbates symptoms as well as sleeping on left side.  DM II, following with endocrinologist. Lab Results  Component Value Date   HGBA1C 11.3 (A) 07/26/2021   Cervical MRI done on 11/18/20: At C5-C6, there is mild disc degeneration. Disc bulge. Uncovertebral hypertrophy (greater on the left). Minimal facet arthrosis (predominantly on the left). Minimal partial effacement of the ventral thecal sac (without spinal cord mass effect). Moderate left neural foraminal narrowing. No significant spinal canal stenosis at the remaining levels. Neural foraminal narrowing on the left at C3-C4 (minimal) and on the left at C4-C5 (mild/moderate).  Mild cervical pain, no limitation of movement. She was evaluated by neurosurgeon, did not follow and did not do PT as  recommended because pain resolved.  Lab Results  Component Value Date   ALT 32 (H) 10/31/2019   AST 16 10/31/2019   BILITOT 0.4 10/31/2019   Ulcerative colitis: She has not had an exacerbation in years. She tries to avoid foods she knows exacerbate problem.  Hypertension:  Medications:Enalapril 10 mg daily. Negative for unusual or severe headache, visual changes, exertional chest pain, dyspnea,  focal weakness, or edema. Component     Latest Ref Rng 08/21/2020  Glucose     70 - 99 mg/dL 409 (H)   BUN     6 - 23 mg/dL 15   Creatinine     8.11 - 1.20 mg/dL 9.14   BUN/Creatinine Ratio     9 - 23    Sodium     135 - 145 mEq/L 133 (L)   Potassium     3.5 - 5.1 mEq/L 4.0   Chloride     96 - 112 mEq/L 99   CO2     19 - 32 mEq/L 25   Calcium     8.4 - 10.5 mg/dL 9.3   GFR, Est Non African American     >59 mL/min/1.73   GFR, Est African American     >59 mL/min/1.73     Hypothyroidism: She is on Levothyroxine 125 mcg 1 tab daily x 5 d and 1/2 tab Tue and Thurs. Last TSH 0.12 in 08/2020. Negative for palpitations,tremor,or abnormal wt loss.  Review of Systems  Constitutional:  Negative for activity change, appetite change and fever.  HENT:  Negative for mouth sores and nosebleeds.   Respiratory:  Negative for cough and wheezing.   Gastrointestinal:  Negative for abdominal pain, nausea and vomiting.       Negative for changes in bowel habits.  Endocrine: Negative for cold intolerance and heat intolerance.  Genitourinary:  Negative for decreased urine volume and hematuria.  Skin:  Negative for itching.  Neurological:  Positive for tingling and numbness. Negative for syncope, facial asymmetry and weakness.  Rest see pertinent positives and negatives per HPI.  Current Outpatient Medications on File Prior to Visit  Medication Sig Dispense Refill   albuterol (VENTOLIN HFA) 108 (90 Base) MCG/ACT inhaler INHALE 2 PUFFS INTO THE LUNGS EVERY 6 HOURS AS NEEDED FOR WHEEZING OR SHORTNESS  OF BREATH 6.7 g 0   Continuous Blood Gluc Receiver (DEXCOM G6 RECEIVER) DEVI USE AS DIRECTED 1 each 0   Continuous Blood Gluc Sensor (DEXCOM G7 SENSOR) MISC 1 Device by Does not apply route as directed. Every 10 days 9 each 3   Continuous Blood Gluc Transmit (DEXCOM G6 TRANSMITTER) MISC 1 Device by Does not apply route as directed. 1 each 3   enalapril (VASOTEC) 10 MG tablet Take 1 tablet (10 mg total) by mouth daily. 90 tablet 3   hydrOXYzine (VISTARIL) 25 MG capsule Take 1 capsule (25 mg total) by mouth every 8 (eight) hours as needed. 30 capsule 0   insulin aspart (NOVOLOG FLEXPEN) 100 UNIT/ML FlexPen Max daily 80 units 75 mL 3   Insulin Glargine (BASAGLAR KWIKPEN) 100 UNIT/ML ADMINISTER 32 UNITS UNDER THE SKIN DAILY 15 mL 2   Insulin Pen Needle 30G X 5 MM MISC 1 Device by Does not apply route in the morning, at noon, in the evening, and at bedtime. 400 each 3   levothyroxine (SYNTHROID) 125 MCG tablet TAKE 1/2 TABLET BY MOUTH ON TUESDAYS AND THURSDAYS, AND 1 TABLET REST OF DAYS. 90 tablet 0   metFORMIN (GLUCOPHAGE-XR) 750 MG 24 hr tablet Take 1 tablet (750 mg total) by mouth in the morning and at bedtime. 180 tablet 3   omeprazole (PRILOSEC) 20 MG capsule TAKE 1 CAPSULE BY MOUTH EVERY DAY BEFORE BREAKFAST 30 capsule 1   rosuvastatin (CRESTOR) 20 MG tablet TAKE 1 TABLET BY MOUTH DAILY 90 tablet 3   Semaglutide,0.25 or 0.5MG /DOS, (OZEMPIC, 0.25 OR 0.5 MG/DOSE,) 2 MG/1.5ML SOPN Inject 0.5 mg into the skin once a week. 4.5 mL 1   terconazole (TERAZOL 7) 0.4 % vaginal cream Place 1 applicator vaginally at bedtime. 45 g 1   trimethoprim-polymyxin b (POLYTRIM) ophthalmic solution Place 1 drop into both eyes every 4 (four) hours. X 5 days 10 mL 0   Zoster Vaccine Adjuvanted [facility]) injection 0.5 ml in muscle and repeat in 8 weeks 0.5 mL 1   No current facility-administered medications on file prior to visit.   Past Medical History:  Diagnosis Date   Diabetes mellitus without complication (HCC)     Hyperlipidemia    Hypertension    Metabolic syndrome    Ulcerative colitis (HCC)    Ulcerative colitis, acute (HCC)    No Known Allergies  Social History   Socioeconomic History   Marital status: Single    Spouse name: Not on file   Number of children: 1   Years of education: Not on file   Highest education level: Not on file  Occupational History   Not on file  Tobacco Use   Smoking status: Never   Smokeless tobacco: Never  Vaping Use   Vaping Use: Never used  Substance and Sexual Activity   Alcohol use: Yes    Comment:  occasionally    Drug use: No   Sexual activity: Yes    Partners: Male    Comment: 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53, PARTNERS- 6, 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   Other Topics Concern   Not on file  Social History Narrative   Not on file   Social Determinants of Health   Financial Resource Strain: Not on file  Food Insecurity: Not on file  Transportation Needs: Not on file  Physical Activity: Not on file  Stress: Not on file  Social Connections: Not on file   Vitals:   12/09/21 1411  BP: 122/76  Pulse: 100  Resp: 12  Temp: 98.5 F (36.9 C)  SpO2: 97%   Body mass index is 27.19 kg/m.  Physical Exam Vitals and nursing note reviewed.  Constitutional:      General: She is not in acute distress.    Appearance: She is well-developed and well-groomed.  HENT:     Head: Normocephalic and atraumatic.     Mouth/Throat:     Mouth: Mucous membranes are moist.     Pharynx: Oropharynx is clear.  Eyes:     Conjunctiva/sclera: Conjunctivae normal.  Cardiovascular:     Rate and Rhythm: Normal rate and regular rhythm.     Pulses:          Radial pulses are 2+ on the left side.       Dorsalis pedis pulses are 2+ on the right side and 2+ on the left side.     Heart sounds: No murmur heard. Pulmonary:     Effort: Pulmonary effort is normal. No respiratory distress.     Breath sounds: Normal breath sounds.  Abdominal:     Palpations: Abdomen is soft. There is  no hepatomegaly or mass.     Tenderness: There is no abdominal tenderness.  Musculoskeletal:     Cervical back: No edema or erythema. Pain with movement present. Normal range of motion.     Comments: LUE pain elicited with neck movement toward left .  Lymphadenopathy:     Cervical: No cervical adenopathy.  Skin:    General: Skin is warm.     Findings: No erythema or rash.  Neurological:     General: No focal deficit present.     Mental Status: She is alert and oriented to person, place, and time.     Cranial Nerves: No cranial nerve deficit.     Gait: Gait normal.   ASSESSMENT AND PLAN:  Ms.Johnell was seen today for shoulder pain.  Diagnoses and all orders for this visit: Orders Placed This Encounter  Procedures   Varicella-zoster vaccine IM   Flu Vaccine QUAD 28mo+IM (Fluarix, Fluzone & Alfiuria Quad PF)   Comprehensive metabolic panel   TSH   Ambulatory referral to Neurosurgery   Lab Results  Component Value Date   TSH 1.24 12/09/2021   Lab Results  Component Value Date   CREATININE 0.81 12/09/2021   BUN 14 12/09/2021   NA 132 (L) 12/09/2021   K 4.0 12/09/2021   CL 99 12/09/2021   CO2 23 12/09/2021   Lab Results  Component Value Date   ALT 27 12/09/2021   AST 24 12/09/2021   ALKPHOS 79 12/09/2021   BILITOT 0.3 12/09/2021   Diffuse pain in left upper extremity We discussed possible etiologies. She is concerned about this being related to DM II, peripheral neuropathy is in the differential Dx, hx suggest more radicular pain. Glucose needs to be better controlled. EMG could be obtained  by neurosurgeon if considered appropriate.  Cervical radiculopathy Abnormal cervical MRI in 11/2020 and she has already seen neurosurgeon, recommend arranging a f/u appt, new referral placed. We discussed treatment options, she agrees with trying Gabapentin. Some side effects discussed.  -     gabapentin (NEURONTIN) 300 MG capsule; Take 1 capsule (300 mg total) by mouth at  bedtime.  Hypothyroidism, unspecified type Last TSH abnormal. Continue same dose of Levothyroxine. Further recommendations according to TSH result.  Hypertension, essential, benign BP adequately controlled. Continue Enalapril 10 mg daily and low salt diet.  Need for influenza vaccination -     Flu Vaccine QUAD 75mo+IM (Fluarix, Fluzone & Alfiuria Quad PF)  Need for shingles vaccine -     Varicella-zoster vaccine IM  Ulcerative colitis without complications, unspecified location (HCC) In remission. Continue following with GI.  Return in about 3 months (around 03/10/2022).  Kindell Strada G. [place], MD  [facility]. [place] office.

## 2021-12-09 ENCOUNTER — Ambulatory Visit: Payer: BC Managed Care – PPO | Admitting: Family Medicine

## 2021-12-09 ENCOUNTER — Encounter: Payer: Self-pay | Admitting: Family Medicine

## 2021-12-09 VITALS — BP 122/76 | HR 100 | Temp 98.5°F | Resp 12 | Ht 69.0 in | Wt 184.1 lb

## 2021-12-09 DIAGNOSIS — M5412 Radiculopathy, cervical region: Secondary | ICD-10-CM | POA: Diagnosis not present

## 2021-12-09 DIAGNOSIS — Z23 Encounter for immunization: Secondary | ICD-10-CM

## 2021-12-09 DIAGNOSIS — M79602 Pain in left arm: Secondary | ICD-10-CM

## 2021-12-09 DIAGNOSIS — K5190 Ulcerative colitis, unspecified, without complications: Secondary | ICD-10-CM

## 2021-12-09 DIAGNOSIS — I10 Essential (primary) hypertension: Secondary | ICD-10-CM | POA: Diagnosis not present

## 2021-12-09 DIAGNOSIS — E039 Hypothyroidism, unspecified: Secondary | ICD-10-CM

## 2021-12-09 MED ORDER — GABAPENTIN 300 MG PO CAPS: 300.0000 mg | ORAL_CAPSULE | Freq: Every day | ORAL | 1 refills | Status: AC

## 2021-12-09 NOTE — Patient Instructions (Addendum)
A few things to remember from today's visit:   Diffuse pain in left upper extremity  Ulcerative colitis without complications, unspecified location [facility]), Chronic  Hypertension, essential, benign - Plan: Comprehensive metabolic panel  Cervical radiculopathy - Plan: Ambulatory referral to Neurosurgery, gabapentin (NEURONTIN) 300 MG capsule  If you need refills for medications you take chronically, please call your pharmacy. Do not use My Chart to request refills or for acute issues that need immediate attention. If you send a my chart message, it may take a few days to be addressed, specially if I am not in the office.  Please be sure medication list is accurate. If a new problem present, please set up appointment sooner than planned today.  Please call neurosurgeon's office for follow up, I placed another referral.  [facility]: Address: [address] [place], [place] [address] Phone: [phone] Fax [phone]    Gabapentin 300 mg at bedtime started today.

## 2021-12-10 ENCOUNTER — Ambulatory Visit: Payer: Self-pay | Admitting: Internal Medicine

## 2021-12-10 LAB — COMPREHENSIVE METABOLIC PANEL
ALT: 27 U/L (ref 0–35)
AST: 24 U/L (ref 0–37)
Albumin: 4.4 g/dL (ref 3.5–5.2)
Alkaline Phosphatase: 79 U/L (ref 39–117)
BUN: 14 mg/dL (ref 6–23)
CO2: 23 mEq/L (ref 19–32)
Calcium: 9.6 mg/dL (ref 8.4–10.5)
Chloride: 99 mEq/L (ref 96–112)
Creatinine, Ser: 0.81 mg/dL (ref 0.40–1.20)
GFR: 83.89 mL/min (ref >60.00)
Glucose, Bld: 246 mg/dL — ABNORMAL HIGH (ref 70–99)
Potassium: 4 mEq/L (ref 3.5–5.1)
Sodium: 132 mEq/L — ABNORMAL LOW (ref 135–145)
Total Bilirubin: 0.3 mg/dL (ref 0.2–1.2)
Total Protein: 8 g/dL (ref 6.0–8.3)

## 2021-12-10 LAB — TSH: TSH: 1.24 u[IU]/mL (ref 0.35–5.50)

## 2021-12-10 NOTE — Progress Notes (Deleted)
Name: Megan [name]  Age/ Sex: 51 y.o., female   MRN/ DOB: [id number], [birth year]     PCP: Martinique, Betty G, MD   Reason for Endocrinology Evaluation: Type 2 Diabetes Mellitus  Initial Endocrine Consultative Visit: 11/15/2019    PATIENT IDENTIFIER: Megan [name] is a 51 y.o. female with a past medical history of T2Dm and UC. The patient has followed with Endocrinology clinic since 11/15/2019 for consultative assistance with management of her diabetes.  DIABETIC HISTORY:  Ms. Fullen was diagnosed with DM in her 12's.Ozempic - persistent hyperglycemia . Janumet started 10/2019 . Her hemoglobin A1c has ranged from 10.7% in 2020, peaking at 12.9% in 2021  On her initial visit to our clinic she had an A1c of 12.9% . She had just been started on Janumet . We started basal insulin    Prandial insulin started 07/2020   SUBJECTIVE:   During the last visit (07/26/2021): A1c 11.3 %.     Today (12/10/2021): Ms. Heck is here for a follow up on diabetes management.  She has not been able to use dexcom due to insurance issues until recently when she started on G7 but having malfunction issues.    She avoids sugar- sweetened beverages  She is psychologically bothered by weight gain around abdominal area    HOME DIABETES REGIMEN:  Metformin 750 mg XR twice daily Ozempic 0.25 mg once weekly for 6 weeks then increase to 0.5 mg weekly Basaglar 40  units daily  Novolog  16 units with each meal  CF: NovoLog (BG -130/20)    Statin: yes ACE-I/ARB: yes     CONTINUOUS GLUCOSE MONITORING RECORD INTERPRETATION : no data        DIABETIC COMPLICATIONS: Microvascular complications:   Denies: CKD, retinopathy , neuropathy  Last Eye Exam: Completed   Macrovascular complications:   Denies: CAD, CVA, PVD   HISTORY:  Past Medical History:  Past Medical History:  Diagnosis Date   Diabetes mellitus without complication ([place])    Hyperlipidemia    Hypertension     Metabolic syndrome    Ulcerative colitis ([place])    Ulcerative colitis, acute ([place])    Past Surgical History:  Past Surgical History:  Procedure Laterality Date   EYE SURGERY     Social History:  reports that she has never smoked. She has never used smokeless tobacco. She reports current alcohol use. She reports that she does not use drugs. Family History:  Family History  Problem Relation Age of Onset   Asthma Mother    COPD Mother    Hypertension Mother    Cancer Maternal Grandfather        LUNG   Diabetes Maternal Grandfather    Cancer Paternal Grandmother        UTERINE   Breast cancer Neg Hx      HOME MEDICATIONS: Allergies as of 12/10/2021   No Known Allergies      Medication List        Accurate as of December 10, 2021  7:45 AM. If you have any questions, ask your nurse or doctor.          albuterol 108 (90 Base) MCG/ACT inhaler Commonly known as: VENTOLIN HFA INHALE 2 PUFFS INTO THE LUNGS EVERY 6 HOURS AS NEEDED FOR WHEEZING OR SHORTNESS OF BREATH   Basaglar KwikPen 100 UNIT/ML ADMINISTER 32 UNITS UNDER THE SKIN DAILY   Dexcom G6 Receiver Devi USE AS DIRECTED   Dexcom G6 Transmitter Misc 1 Device by Does  not apply route as directed.   Dexcom G7 Sensor Misc 1 Device by Does not apply route as directed. Every 10 days   enalapril 10 MG tablet Commonly known as: VASOTEC Take 1 tablet (10 mg total) by mouth daily.   gabapentin 300 MG capsule Commonly known as: NEURONTIN Take 1 capsule (300 mg total) by mouth at bedtime.   hydrOXYzine 25 MG capsule Commonly known as: VISTARIL Take 1 capsule (25 mg total) by mouth every 8 (eight) hours as needed.   Insulin Pen Needle 30G X 5 MM Misc 1 Device by Does not apply route in the morning, at noon, in the evening, and at bedtime.   levothyroxine 125 MCG tablet Commonly known as: SYNTHROID TAKE 1/2 TABLET BY MOUTH ON TUESDAYS AND THURSDAYS, AND 1 TABLET REST OF DAYS.   metFORMIN 750 MG 24 hr  tablet Commonly known as: GLUCOPHAGE-XR Take 1 tablet (750 mg total) by mouth in the morning and at bedtime.   NovoLOG FlexPen 100 UNIT/ML FlexPen Generic drug: insulin aspart Max daily 80 units   omeprazole 20 MG capsule Commonly known as: PRILOSEC TAKE 1 CAPSULE BY MOUTH EVERY DAY BEFORE BREAKFAST   Ozempic (0.25 or 0.5 MG/DOSE) 2 MG/1.5ML Sopn Generic drug: Semaglutide(0.25 or 0.5MG/DOS) Inject 0.5 mg into the skin once a week.   rosuvastatin 20 MG tablet Commonly known as: CRESTOR TAKE 1 TABLET BY MOUTH DAILY   Shingrix injection Generic drug: Zoster Vaccine Adjuvanted 0.5 ml in muscle and repeat in 8 weeks   terconazole 0.4 % vaginal cream Commonly known as: TERAZOL 7 Place 1 applicator vaginally at bedtime.   trimethoprim-polymyxin b ophthalmic solution Commonly known as: Polytrim Place 1 drop into both eyes every 4 (four) hours. X 5 days         OBJECTIVE:   Vital Signs: LMP 11/16/2021    Wt Readings from Last 3 Encounters:  12/09/21 184 lb 2 oz (83.5 kg)  07/26/21 189 lb (85.7 kg)  04/24/21 185 lb (83.9 kg)     Exam: General: Pt appears well and is in NAD  Lungs: Clear with good BS bilat with no rales, rhonchi, or wheezes  Heart: RRR with normal S1 and S2 and no gallops; no murmurs; no rub  Abdomen: Normoactive bowel sounds, soft, nontender, without masses or organomegaly palpable  Extremities: No pretibial edema.  Neuro: MS is good with appropriate affect, pt is alert and Ox3      DATA REVIEWED:  Lab Results  Component Value Date   HGBA1C 11.3 (A) 07/26/2021   HGBA1C 9.1 (A) 10/11/2020   HGBA1C 10.0 (A) 05/17/2020   Lab Results  Component Value Date   MICROALBUR 0.8 10/31/2019   LDLCALC 166 (H) 10/31/2019   CREATININE 0.62 08/21/2020   Lab Results  Component Value Date   MICRALBCREAT 9 10/31/2019     Lab Results  Component Value Date   CHOL 238 (H) 10/31/2019   HDL 39 (L) 10/31/2019   LDLCALC 166 (H) 10/31/2019   TRIG 180  (H) 10/31/2019   CHOLHDL 6.1 (H) 10/31/2019       Results for SHADARA, LOPEZ (MRN [id number]) as of 10/12/2020 16:30  Ref. Range 05/17/2020 08:09  ISLET CELL ANTIBODY SCREEN Latest Ref Range: NEGATIVE  NEGATIVE  Results for SHAUNTEA, LOK (MRN [id number]) as of 10/12/2020 16:30  Ref. Range 05/17/2020 08:09  Glutamic Acid Decarb Ab Latest Ref Range: <5 IU/mL <5    ASSESSMENT / PLAN / RECOMMENDATIONS:   1) Type 2 Diabetes Mellitus,  Poorly controlled, With out complications - Most recent A1c of 11.3 %. Goal A1c < 7.0 %.    -The patient has not been here in approximately 10 months, discussed importance of regular office visits and making adjustments as necessary - GAD-65 and Islet cell Ab have come back negative -Historically she had declined weight loss medication but today she has become bothered by abdominal weight gain and is willing to try Ozempic -We will stop Janumet, patient understands she cannot take Ozempic and Januvia at the same time -I am going to increase her insulin as below    MEDICATIONS:  -Start metformin 750 mg XR twice daily -Start Ozempic 0.25 mg once weekly for 6 weeks then increase to 0.5 mg weekly -Increase Basaglar 40  units daily  -Continue Novolog  16 units with each meal  -CF: NovoLog (BG -130/20)  EDUCATION / INSTRUCTIONS: BG monitoring instructions: Patient is instructed to check her blood sugars 3 times a day, before meals  Call [facility] if: BG persistently < 70  I reviewed the Rule of 15 for the treatment of hypoglycemia in detail with the patient. Literature supplied.    2) Diabetic complications:  Eye: Does not have known diabetic retinopathy.  Neuro/ Feet: Does not have known diabetic peripheral neuropathy .  Renal: Patient does not have known baseline CKD. She   is not on an ACEI/ARB at present.        F/U in 4 months   Signed electronically by: Mack Guise, MD  [facility]   [facility] [address] [place], [place] [address] Phone: [phone] FAX: [phone]   CC: Martinique, Betty G, [place] [address] Phone: [phone]  Fax: [phone]  Return to Endocrinology clinic as below: Future Appointments  Date Time Provider [place]  12/10/2021  3:00 PM Beni Turrell, Melanie Crazier, MD [facility] None

## 2021-12-11 ENCOUNTER — Encounter: Payer: Self-pay | Admitting: Internal Medicine

## 2022-02-12 ENCOUNTER — Ambulatory Visit (INDEPENDENT_AMBULATORY_CARE_PROVIDER_SITE_OTHER): Payer: BC Managed Care – PPO | Admitting: Internal Medicine

## 2022-02-12 ENCOUNTER — Telehealth: Payer: Self-pay | Admitting: Family Medicine

## 2022-02-12 ENCOUNTER — Telehealth: Payer: Self-pay | Admitting: Internal Medicine

## 2022-02-12 ENCOUNTER — Encounter: Payer: Self-pay | Admitting: Internal Medicine

## 2022-02-12 DIAGNOSIS — E1169 Type 2 diabetes mellitus with other specified complication: Secondary | ICD-10-CM

## 2022-02-12 NOTE — Telephone Encounter (Signed)
New referral has been placed to [facility].

## 2022-02-12 NOTE — Progress Notes (Signed)
Pt's appointment was 10:50 Am , she showed up 20 minutes late to her appointment which was past the duration of the actual appointment.    Abby Nena Jordan, MD  [facility] End[facility]  [facility] [address] [place], [place] [address] Phone: [phone] FAX: [phone]

## 2022-02-12 NOTE — Telephone Encounter (Signed)
Patient states her Endo Dr Kelton Pillar has dismissed her from the practice for missing appointment. Seeking a referral to another endo provider.

## 2022-02-12 NOTE — Telephone Encounter (Signed)
Patient arrived 20 minutes after appointment time (arrived at 1110 AM for a 1050 AM appointment). Per MD not able to be seen today nor rescheduled. Patient was advised of this at check in window. Did ask why and was advised that per MD due to the number of cancellations and no shows that rescheduling was not an option.  A letter was sent to patient on 12/11/21 by provider advising of missed appointments. See letter tab

## 2022-02-14 ENCOUNTER — Telehealth: Payer: Self-pay | Admitting: Internal Medicine

## 2022-02-14 NOTE — Telephone Encounter (Signed)
Patient dismissed from care under Mack Guise, MD 02/12/22

## 2022-03-03 ENCOUNTER — Encounter: Payer: Self-pay | Admitting: Family Medicine

## 2022-05-15 ENCOUNTER — Encounter: Payer: Self-pay | Admitting: Family Medicine

## 2022-05-15 DIAGNOSIS — M79602 Pain in left arm: Secondary | ICD-10-CM

## 2022-05-15 DIAGNOSIS — M5412 Radiculopathy, cervical region: Secondary | ICD-10-CM

## 2022-05-17 ENCOUNTER — Other Ambulatory Visit: Payer: Self-pay | Admitting: Family Medicine

## 2022-05-17 DIAGNOSIS — K219 Gastro-esophageal reflux disease without esophagitis: Secondary | ICD-10-CM

## 2022-05-19 ENCOUNTER — Telehealth (INDEPENDENT_AMBULATORY_CARE_PROVIDER_SITE_OTHER): Payer: BC Managed Care – PPO | Admitting: Family Medicine

## 2022-05-19 ENCOUNTER — Encounter: Payer: Self-pay | Admitting: Family Medicine

## 2022-05-19 VITALS — Ht 69.0 in

## 2022-05-19 DIAGNOSIS — U071 COVID-19: Secondary | ICD-10-CM

## 2022-05-19 NOTE — Progress Notes (Signed)
Virtual Visit via Video Note I connected with Maurie Boettcher on 05/19/22 by a video enabled telemedicine application and verified that I am speaking with the correct person using two identifiers. Location patient: Conserv[place], museum/gallery provider:work office Persons participating in the virtual visit: patient, provider  I discussed the limitations of evaluation and management by telemedicine and the availability of in person appointments. The patient expressed understanding and agreed to proceed.  Chief Complaint  Patient presents with   Covid Positive    Tested positive 3/1, needs work note.   HPI: [name] is a 52 yo female with history of DM 2, hyperlipidemia, and hypertension reporting a positive COVID-19 test on 05/16/2022.  Symptoms have improved, still having residual nasal congestion, rhinorrhea, postnasal drainage, and cough.  She reports having experienced fever, chills, headache, and sinus pressure since Wednesday, but states that these symptoms have since resolved. She has been taking Mucinex (blue box), benzonatate (as previously prescribed), and Alka-Seltzer Cold and Flu to manage her symptoms.   She reports feeling better overall, with her energy levels starting to return, but she is not yet at 100 percent.  She requests a note for work, indicating a preference to return on Thursday.  ROS: See pertinent positives and negatives per HPI.  Past Medical History:  Diagnosis Date   Diabetes mellitus without complication ([place])    Hyperlipidemia    Hypertension    Metabolic syndrome    Ulcerative colitis ([place])    Ulcerative colitis, acute ([place])    Past Surgical History:  Procedure Laterality Date   EYE SURGERY     Family History  Problem Relation Age of Onset   Asthma Mother    COPD Mother    Hypertension Mother    Cancer Maternal Grandfather        LUNG   Diabetes Maternal Grandfather    Cancer Paternal Grandmother        UTERINE   Breast cancer Neg Hx     Social History   Socioeconomic History   Marital status: Single    Spouse name: Not on file   Number of children: 1   Years of education: Not on file   Highest education level: Not on file  Occupational History   Not on file  Tobacco Use   Smoking status: Never   Smokeless tobacco: Never  Vaping Use   Vaping Use: Never used  Substance and Sexual Activity   Alcohol use: Yes    Comment: occasionally    Drug use: No   Sexual activity: Yes    Partners: Male    Comment: 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11, PARTNERS- 6, 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   Other Topics Concern   Not on file  Social History Narrative   Not on file   Social Determinants of Health   Financial Resource Strain: Not on file  Food Insecurity: Not on file  Transportation Needs: Not on file  Physical Activity: Not on file  Stress: Not on file  Social Connections: Not on file  Intimate Partner Violence: Not on file    Current Outpatient Medications:    albuterol (VENTOLIN HFA) 108 (90 Base) MCG/ACT inhaler, INHALE 2 PUFFS INTO THE LUNGS EVERY 6 HOURS AS NEEDED FOR WHEEZING OR SHORTNESS OF BREATH, Disp: 6.7 g, Rfl: 0   Continuous Blood Gluc Receiver (DEXCOM G6 RECEIVER) DEVI, USE AS DIRECTED, Disp: 1 each, Rfl: 0   Continuous Blood Gluc Sensor (DEXCOM G7 SENSOR) MISC, 1 Device by Does not apply route as directed.  Every 10 days, Disp: 9 each, Rfl: 3   Continuous Blood Gluc Transmit (DEXCOM G6 TRANSMITTER) MISC, 1 Device by Does not apply route as directed., Disp: 1 each, Rfl: 3   enalapril (VASOTEC) 10 MG tablet, Take 1 tablet (10 mg total) by mouth daily., Disp: 90 tablet, Rfl: 3   gabapentin (NEURONTIN) 300 MG capsule, Take 1 capsule (300 mg total) by mouth at bedtime., Disp: 30 capsule, Rfl: 1   hydrOXYzine (VISTARIL) 25 MG capsule, Take 1 capsule (25 mg total) by mouth every 8 (eight) hours as needed., Disp: 30 capsule, Rfl: 0   insulin aspart (NOVOLOG FLEXPEN) 100 UNIT/ML FlexPen, Max daily 80 units, Disp: 75 mL, Rfl:  3   Insulin Glargine (BASAGLAR KWIKPEN) 100 UNIT/ML, ADMINISTER 32 UNITS UNDER THE SKIN DAILY, Disp: 15 mL, Rfl: 2   Insulin Pen Needle 30G X 5 MM MISC, 1 Device by Does not apply route in the morning, at noon, in the evening, and at bedtime., Disp: 400 each, Rfl: 3   levothyroxine (SYNTHROID) 125 MCG tablet, TAKE 1/2 TABLET BY MOUTH ON TUESDAYS AND THURSDAYS, AND 1 TABLET REST OF DAYS., Disp: 90 tablet, Rfl: 0   metFORMIN (GLUCOPHAGE-XR) 750 MG 24 hr tablet, Take 1 tablet (750 mg total) by mouth in the morning and at bedtime., Disp: 180 tablet, Rfl: 3   omeprazole (PRILOSEC) 20 MG capsule, TAKE 1 CAPSULE BY MOUTH EVERY DAY BEFORE BREAKFAST, Disp: 90 capsule, Rfl: 1   rosuvastatin (CRESTOR) 20 MG tablet, TAKE 1 TABLET BY MOUTH DAILY, Disp: 90 tablet, Rfl: 3   Semaglutide,0.25 or 0.'5MG'$ /DOS, (OZEMPIC, 0.25 OR 0.5 MG/DOSE,) 2 MG/1.5ML SOPN, Inject 0.5 mg into the skin once a week., Disp: 4.5 mL, Rfl: 1   terconazole (TERAZOL 7) 0.4 % vaginal cream, Place 1 applicator vaginally at bedtime., Disp: 45 g, Rfl: 1   trimethoprim-polymyxin b (POLYTRIM) ophthalmic solution, Place 1 drop into both eyes every 4 (four) hours. X 5 days, Disp: 10 mL, Rfl: 0   Zoster Vaccine Adjuvanted (SHINGRIX) injection, 0.5 ml in muscle and repeat in 8 weeks, Disp: 0.5 mL, Rfl: 1  EXAM:  VITALS per patient if applicable:Ht '5\' 9"'$  (1.753 m)   BMI 27.19 kg/m   GENERAL: alert, oriented, appears well and in no acute distress  HEENT: atraumatic, conjunctiva clear, no obvious abnormalities on inspection of external nose and ears  NECK: normal movements of the head and neck  LUNGS: on inspection no signs of respiratory distress, breathing rate appears normal, no obvious gross SOB, gasping or wheezing  CV: no obvious cyanosis  MS: moves all visible extremities without noticeable abnormality  PSYCH/NEURO: pleasant and cooperative, no obvious depression or anxiety, speech and thought processing grossly intact  ASSESSMENT AND  PLAN:  Discussed the following assessment and plan:  COVID-19 virus infection  Symptoms have improved, she is not having fever. We discussed new CDC guidelines, she would like to go back to work Thursday. Continue symptomatic treatment. Explained that nasal congestion and cough can last a few more days and sometimes weeks. I do not think antiviral medication is needed at this time given the fact most symptoms have resolved. Instructed about warning signs.  We discussed possible serious and likely etiologies, options for evaluation and workup, limitations of telemedicine visit vs in person visit, treatment, treatment risks and precautions. The patient was advised to call back or seek an in-person evaluation if the symptoms worsen or if the condition fails to improve as anticipated. I discussed the assessment and treatment plan  with the patient. The patient was provided an opportunity to ask questions and all were answered. The patient agreed with the plan and demonstrated an understanding of the instructions.  Return if symptoms worsen or fail to improve, for keep next appointment.  Tylen Leverich G. [place], MD  [facility] [place] office.

## 2022-05-21 ENCOUNTER — Encounter: Payer: Self-pay | Admitting: Family Medicine

## 2022-05-30 ENCOUNTER — Other Ambulatory Visit (HOSPITAL_COMMUNITY): Payer: Self-pay

## 2022-06-02 ENCOUNTER — Other Ambulatory Visit: Payer: Self-pay | Admitting: Family Medicine

## 2022-06-02 DIAGNOSIS — I10 Essential (primary) hypertension: Secondary | ICD-10-CM

## 2022-06-02 DIAGNOSIS — E039 Hypothyroidism, unspecified: Secondary | ICD-10-CM

## 2022-06-09 ENCOUNTER — Encounter: Payer: Self-pay | Admitting: Family Medicine

## 2022-06-09 DIAGNOSIS — E1169 Type 2 diabetes mellitus with other specified complication: Secondary | ICD-10-CM

## 2022-06-23 ENCOUNTER — Ambulatory Visit: Payer: BC Managed Care – PPO | Admitting: Podiatry

## 2022-07-29 ENCOUNTER — Other Ambulatory Visit: Payer: Self-pay | Admitting: Radiology

## 2022-08-16 ENCOUNTER — Other Ambulatory Visit: Payer: Self-pay | Admitting: Internal Medicine

## 2022-08-16 ENCOUNTER — Other Ambulatory Visit: Payer: Self-pay | Admitting: Radiology

## 2022-11-19 ENCOUNTER — Other Ambulatory Visit: Payer: Self-pay | Admitting: Family Medicine

## 2022-11-19 DIAGNOSIS — K219 Gastro-esophageal reflux disease without esophagitis: Secondary | ICD-10-CM

## 2022-12-02 ENCOUNTER — Other Ambulatory Visit: Payer: Self-pay | Admitting: Family Medicine

## 2022-12-02 DIAGNOSIS — Z1231 Encounter for screening mammogram for malignant neoplasm of breast: Secondary | ICD-10-CM

## 2022-12-23 ENCOUNTER — Ambulatory Visit
Admission: RE | Admit: 2022-12-23 | Discharge: 2022-12-23 | Disposition: A | Discharge status: Home / Self Care | Payer: BC Managed Care – PPO | Source: Ambulatory Visit | Attending: Family Medicine | Admitting: Family Medicine

## 2022-12-23 DIAGNOSIS — Z1231 Encounter for screening mammogram for malignant neoplasm of breast: Secondary | ICD-10-CM

## 2023-01-07 ENCOUNTER — Ambulatory Visit: Payer: BC Managed Care – PPO | Admitting: Gastroenterology

## 2023-02-11 ENCOUNTER — Other Ambulatory Visit: Payer: Self-pay | Admitting: Family Medicine

## 2023-02-11 DIAGNOSIS — I10 Essential (primary) hypertension: Secondary | ICD-10-CM

## 2023-03-17 ENCOUNTER — Encounter: Payer: Self-pay | Admitting: Family Medicine

## 2023-03-20 ENCOUNTER — Telehealth: Payer: Self-pay

## 2023-03-20 NOTE — Telephone Encounter (Signed)
 Copied from CRM [id number]. Topic: General - Other >> Mar 20, 2023  3:49 PM Sonny Dandy B wrote: Reason for CRM: Pt called to follow up on previous message sent regarding Emotional support letter she requested. Pt is requesting a call back at [phone]

## 2023-03-23 NOTE — Telephone Encounter (Signed)
 See my chart encounter.

## 2023-03-24 ENCOUNTER — Ambulatory Visit: Payer: Self-pay | Admitting: Internal Medicine

## 2023-03-25 ENCOUNTER — Encounter: Payer: Self-pay | Admitting: Family Medicine

## 2023-03-25 ENCOUNTER — Telehealth: Payer: Self-pay | Admitting: Family Medicine

## 2023-03-25 ENCOUNTER — Telehealth (INDEPENDENT_AMBULATORY_CARE_PROVIDER_SITE_OTHER): Payer: 59 | Admitting: Family Medicine

## 2023-03-25 VITALS — Ht 69.0 in

## 2023-03-25 DIAGNOSIS — F419 Anxiety disorder, unspecified: Secondary | ICD-10-CM

## 2023-03-25 DIAGNOSIS — R062 Wheezing: Secondary | ICD-10-CM

## 2023-03-25 DIAGNOSIS — J069 Acute upper respiratory infection, unspecified: Secondary | ICD-10-CM

## 2023-03-25 MED ORDER — FLUTICASONE PROPIONATE 50 MCG/ACT NA SUSP: 1.0000 | Freq: Two times a day (BID) | NASAL | 1 refills | Status: AC

## 2023-03-25 MED ORDER — ALBUTEROL SULFATE HFA 108 (90 BASE) MCG/ACT IN AERS: 2.0000 | INHALATION_SPRAY | Freq: Four times a day (QID) | RESPIRATORY_TRACT | 1 refills | Status: AC | PRN

## 2023-03-25 NOTE — Assessment & Plan Note (Signed)
 Problem has been going on for some time, exacerbated due to school and work. Currently she is not interested in pharmacologic treatment. Letter for emotional support animal will be provided, so she can keep her dog in her apartment.

## 2023-03-25 NOTE — Progress Notes (Signed)
 Virtual Visit via Video Note I connected with [name] on 03/25/2023 by a video enabled telemedicine application and verified that I am speaking with the correct person using two identifiers. Location patient: home Location provider:work office Persons participating in the virtual visit: patient,scribe, provider  I discussed the limitations of evaluation and management by telemedicine and the availability of in person appointments. The patient expressed understanding and agreed to proceed.  Chief Complaint  Patient presents with   emotional support animal   HPI: [name] is a 53 y.o. female patient with a PMHx significant for DM II, HLD, and HTN who is being seen today on video for a letter for an emotional support animal and for congestion.   Anxiety: Patient states she has been overwhelmed with anxiety for a few years, with symptoms worsening this summer. 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  She hasn't tried medication for anxiety before, and is not interested.  She believes her dog helps with her anxiety and needs a letter to be allowed to keep it in her apartment, which is under new administration. Denies depression like symptoms.  Congestion:  Patient complains of nasal congestion, productive cough, and sneezing for the last 5 days. She also has had some wheezing, most recently on 1/6.  She says her sister was sick around Christmas.  Denies a history of allergies.  She has had wheezing before, reports hx of asthma. She has been using saline nasal sprays and some leftover benzonatate  for her symptoms. She would like to get another Albuterol  inhaler.  She says her symptoms are slowly improving.  Pertinent negatives include fever, chills, or body aches.  She has not noted abdominal pain,N/V,changes in bowel habits, urinary symptoms,or skin rash.  ROS: See pertinent positives and negatives per HPI.  Past Medical History:   Diagnosis Date   Diabetes mellitus without complication (HCC)    Hyperlipidemia    Hypertension    Metabolic syndrome    Ulcerative colitis (HCC)    Ulcerative colitis, acute (HCC)    Past Surgical History:  Procedure Laterality Date   EYE SURGERY     Family History  Problem Relation Age of Onset   Asthma Mother    COPD Mother    Hypertension Mother    Cancer Maternal Grandfather        LUNG   Diabetes Maternal Grandfather    Cancer Paternal Grandmother        UTERINE   Breast cancer Neg Hx     Social History   Socioeconomic History   Marital status: Single    Spouse name: Not on file   Number of children: 1   Years of education: Not on file   Highest education level: Not on file  Occupational History   Not on file  Tobacco Use   Smoking status: Never   Smokeless tobacco: Never  Vaping Use   Vaping status: Never Used  Substance and Sexual Activity   Alcohol use: Yes    Comment: occasionally    Drug use: No   Sexual activity: Yes    Partners: Male    Comment: 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29, PARTNERS- 6, 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   Other Topics Concern   Not on file  Social History Narrative   Not on file   Social Drivers of Health   Financial Resource Strain: Not on file  Food Insecurity: Not on file  Transportation Needs: Not on file  Physical Activity: Not on file  Stress: Not on file  Social Connections: Not on file  Intimate Partner Violence: Not on file     Current Outpatient Medications:    Continuous Blood Gluc Receiver (DEXCOM G6 RECEIVER) DEVI, USE AS DIRECTED, Disp: 1 each, Rfl: 0   Continuous Blood Gluc Sensor (DEXCOM G7 SENSOR) MISC, 1 Device by Does not apply route as directed. Every 10 days, Disp: 9 each, Rfl: 3   Continuous Blood Gluc Transmit (DEXCOM G6 TRANSMITTER) MISC, 1 Device by Does not apply route as directed., Disp: 1 each, Rfl: 3   enalapril  (VASOTEC ) 10 MG tablet, TAKE 1 TABLET(10 MG) BY MOUTH DAILY, Disp: 90 tablet, Rfl: 0    fluticasone  (FLONASE ) 50 MCG/ACT nasal spray, Place 1 spray into both nostrils 2 (two) times daily., Disp: 16 g, Rfl: 1   gabapentin  (NEURONTIN ) 300 MG capsule, Take 1 capsule (300 mg total) by mouth at bedtime., Disp: 30 capsule, Rfl: 1   hydrOXYzine  (VISTARIL ) 25 MG capsule, Take 1 capsule (25 mg total) by mouth every 8 (eight) hours as needed., Disp: 30 capsule, Rfl: 0   insulin  aspart (NOVOLOG  FLEXPEN) 100 UNIT/ML FlexPen, Max daily 80 units, Disp: 75 mL, Rfl: 3   Insulin  Glargine (BASAGLAR  KWIKPEN) 100 UNIT/ML, ADMINISTER 32 UNITS UNDER THE SKIN DAILY, Disp: 15 mL, Rfl: 2   Insulin  Pen Needle 30G X 5 MM MISC, 1 Device by Does not apply route in the morning, at noon, in the evening, and at bedtime., Disp: 400 each, Rfl: 3   levothyroxine  (SYNTHROID ) 125 MCG tablet, TAKE 1/2 TABLET BY MOUTH TUESDAYS AND THURSDAYS, AND 1 TABLET REST OF DAYS, Disp: 90 tablet, Rfl: 1   metFORMIN  (GLUCOPHAGE -XR) 750 MG 24 hr tablet, Take 1 tablet (750 mg total) by mouth in the morning and at bedtime., Disp: 180 tablet, Rfl: 3   omeprazole  (PRILOSEC) 20 MG capsule, TAKE 1 CAPSULE BY MOUTH EVERY DAY BEFORE BREAKFAST, Disp: 90 capsule, Rfl: 1   rosuvastatin  (CRESTOR ) 20 MG tablet, TAKE 1 TABLET(20 MG) BY MOUTH DAILY, Disp: 90 tablet, Rfl: 0   Semaglutide ,0.25 or 0.5MG /DOS, (OZEMPIC , 0.25 OR 0.5 MG/DOSE,) 2 MG/1.5ML SOPN, Inject 0.5 mg into the skin once a week., Disp: 4.5 mL, Rfl: 1   terconazole  (TERAZOL 7 ) 0.4 % vaginal cream, Place 1 applicator vaginally at bedtime., Disp: 45 g, Rfl: 1   trimethoprim -polymyxin b  (POLYTRIM ) ophthalmic solution, Place 1 drop into both eyes every 4 (four) hours. X 5 days, Disp: 10 mL, Rfl: 0   albuterol  (VENTOLIN  HFA) 108 (90 Base) MCG/ACT inhaler, Inhale 2 puffs into the lungs every 6 (six) hours as needed for wheezing or shortness of breath., Disp: 6.7 g, Rfl: 1  EXAM:  VITALS per patient if applicable:Ht 5' 9 (1.753 m)   BMI 27.19 kg/m   GENERAL: alert, oriented, appears well  and in no acute distress  HEENT: atraumatic, conjunctiva clear, no obvious abnormalities on inspection of external nose and ears  NECK: normal movements of the head and neck  LUNGS: on inspection no signs of respiratory distress, breathing rate appears normal, no obvious gross SOB, gasping or wheezing  CV: no obvious cyanosis  MS: moves all visible extremities without noticeable abnormality  PSYCH/NEURO: pleasant and cooperative, no obvious depression or anxiety, speech and thought processing grossly intact  ASSESSMENT AND PLAN:  Discussed the following assessment and plan:  Wheezing Has been recurrent, no hx of tobacco use. We discussed possible etiologies, reports some hx of asthma. Albuterol  inh has helped. Albuterol   inh 2 puff every 6 hours for a week then as needed for wheezing or shortness of breath.  CXR in 04/2021 Negative.  -     Albuterol  Sulfate HFA; Inhale 2 puffs into the lungs every 6 (six) hours as needed for wheezing or shortness of breath.  Dispense: 6.7 g; Refill: 1  URI, acute Symptoms suggests a viral etiology, I explained patient that symptomatic treatment is recommended. Reports that symptoms are improving. Flonase  nasal spray recommended to help with nasal congestion.  Instructed to monitor for signs of complications, including new onset of fever among some, clearly instructed about warning signs. I also explained that cough and nasal congestion can last a few days and sometimes weeks. F/U as needed.  -     Fluticasone  Propionate; Place 1 spray into both nostrils 2 (two) times daily.  Dispense: 16 g; Refill: 1  Anxiety disorder, unspecified type Assessment & Plan: Problem has been going on for some time, exacerbated due to school and work. Currently she is not interested in pharmacologic treatment. Letter for emotional support animal will be provided, so she can keep her dog in her apartment.  We discussed possible serious and likely etiologies,  options for evaluation and workup, limitations of telemedicine visit vs in person visit, treatment, treatment risks and precautions. The patient was advised to call back or seek an in-person evaluation if the symptoms worsen or if the condition fails to improve as anticipated. I discussed the assessment and treatment plan with the patient. The patient was provided an opportunity to ask questions and all were answered. The patient agreed with the plan and demonstrated an understanding of the instructions.  Return if symptoms worsen or fail to improve.  I, Leonce PARAS Wierda, acting as a scribe for Dyer Klug, MD., have documented all relevant documentation on the behalf of Dianely Krehbiel, MD, as directed by  Swade Shonka, MD while in the presence of Lakeisha Waldrop, MD.   I, Teeghan Hammer, have reviewed all documentation for this visit. The documentation on 03/26/23 for the exam, diagnosis, procedures, and orders are all accurate and complete.  Jeani Fassnacht, MD

## 2023-03-25 NOTE — Telephone Encounter (Signed)
 Copied from CRM [id number]. Topic: General - Other >> Mar 25, 2023  1:18 PM Gerardine PARAS wrote: Reason for CRM: Patient called in regarding a letter was she states was typed and sent through MyChart regarding emotional support animal paperwork for leasing office and she states she is afraid the letter will not be accepted because certain things are missing out of this letter as her dog being registered as her emotional support animal and asked if letter could be revised with those exact words by [place] and Dr. Jordan. Also stated she was told by animal department  the office has a designated form for this specifically that could be filled out and is wondering if that is an option. Pease follow up with patient through MyChart regarding this

## 2023-03-26 ENCOUNTER — Encounter: Payer: Self-pay | Admitting: Family Medicine

## 2023-04-05 ENCOUNTER — Other Ambulatory Visit: Payer: Self-pay | Admitting: Family Medicine

## 2023-04-05 DIAGNOSIS — E039 Hypothyroidism, unspecified: Secondary | ICD-10-CM

## 2023-06-03 ENCOUNTER — Encounter: Admitting: Family Medicine

## 2023-06-03 NOTE — Progress Notes (Incomplete)
 HPI: [name] is a 53 y.o. female with a PMHx significant for DM II, HLD, UC, hypothyroidism, GERD, HLD, and anxiety. among some, who is here today for her routine physical.  Last CPE: 04/19/2020  Exercise: Diet:  Sleep:  Alcohol Use:  Smoking: Vision:  Dental:  Immunization History  Administered Date(s) Administered   Influenza Inj Mdck Quad Pf 01/05/2021   Influenza,inj,Quad PF,6+ Mos 01/30/2020, 12/09/2021   PFIZER(Purple Top)SARS-COV-2 Vaccination 07/12/2019, 08/01/2019   Pneumococcal Polysaccharide-23 05/09/2020   Tdap 05/09/2020   Zoster Recombinant(Shingrix) 12/09/2021   Health Maintenance  Topic Date Due   OPHTHALMOLOGY EXAM  Never done   HIV Screening  Never done   Colonoscopy  Never done   Diabetic kidney evaluation - Urine ACR  10/30/2020   FOOT EXAM  10/30/2020   Pneumococcal Vaccine 31-50 Years old (2 of 2 - PCV) 05/09/2021   HEMOGLOBIN A1C  01/26/2022   Zoster Vaccines- Shingrix (2 of 2) 02/03/2022   INFLUENZA VACCINE  10/16/2022   COVID-19 Vaccine (3 - 2024-25 season) 11/16/2022   Diabetic kidney evaluation - eGFR measurement  12/10/2022   Cervical Cancer Screening (HPV/Pap Cotest)  02/15/2023   MAMMOGRAM  12/22/2024   DTaP/Tdap/Td (2 - Td or Tdap) 05/09/2030   Hepatitis C Screening  Completed   HPV VACCINES  Aged Out   Chronic medical problems: ***  Concerns today: ***  Review of Systems  Current Outpatient Medications on File Prior to Visit  Medication Sig Dispense Refill   albuterol (VENTOLIN HFA) 108 (90 Base) MCG/ACT inhaler Inhale 2 puffs into the lungs every 6 (six) hours as needed for wheezing or shortness of breath. 6.7 g 1   Continuous Blood Gluc Receiver (DEXCOM G6 RECEIVER) DEVI USE AS DIRECTED 1 each 0   Continuous Blood Gluc Sensor (DEXCOM G7 SENSOR) MISC 1 Device by Does not apply route as directed. Every 10 days 9 each 3   Continuous Blood Gluc Transmit (DEXCOM G6 TRANSMITTER) MISC 1 Device by Does not apply route  as directed. 1 each 3   enalapril (VASOTEC) 10 MG tablet TAKE 1 TABLET(10 MG) BY MOUTH DAILY 90 tablet 0   fluticasone (FLONASE) 50 MCG/ACT nasal spray Place 1 spray into both nostrils 2 (two) times daily. 16 g 1   gabapentin (NEURONTIN) 300 MG capsule Take 1 capsule (300 mg total) by mouth at bedtime. 30 capsule 1   hydrOXYzine (VISTARIL) 25 MG capsule Take 1 capsule (25 mg total) by mouth every 8 (eight) hours as needed. 30 capsule 0   insulin aspart (NOVOLOG FLEXPEN) 100 UNIT/ML FlexPen Max daily 80 units 75 mL 3   Insulin Glargine (BASAGLAR KWIKPEN) 100 UNIT/ML ADMINISTER 32 UNITS UNDER THE SKIN DAILY 15 mL 2   Insulin Pen Needle 30G X 5 MM MISC 1 Device by Does not apply route in the morning, at noon, in the evening, and at bedtime. 400 each 3   levothyroxine (SYNTHROID) 125 MCG tablet TAKE 1/2 TABLET BY MOUTH TUESDAYS AND THURSDAYS AND 1 TABLET THE REST OF THE DAYS 90 tablet 0   metFORMIN (GLUCOPHAGE-XR) 750 MG 24 hr tablet Take 1 tablet (750 mg total) by mouth in the morning and at bedtime. 180 tablet 3   omeprazole (PRILOSEC) 20 MG capsule TAKE 1 CAPSULE BY MOUTH EVERY DAY BEFORE BREAKFAST 90 capsule 1   rosuvastatin (CRESTOR) 20 MG tablet TAKE 1 TABLET(20 MG) BY MOUTH DAILY 90 tablet 0   Semaglutide,0.25 or 0.5MG /DOS, (OZEMPIC, 0.25 OR 0.5 MG/DOSE,) 2 MG/1.5ML SOPN Inject  0.5 mg into the skin once a week. 4.5 mL 1   terconazole (TERAZOL 7) 0.4 % vaginal cream Place 1 applicator vaginally at bedtime. 45 g 1   trimethoprim-polymyxin b (POLYTRIM) ophthalmic solution Place 1 drop into both eyes every 4 (four) hours. X 5 days 10 mL 0   No current facility-administered medications on file prior to visit.    Past Medical History:  Diagnosis Date   Diabetes mellitus without complication (HCC)    Hyperlipidemia    Hypertension    Metabolic syndrome    Ulcerative colitis (HCC)    Ulcerative colitis, acute (HCC)     Past Surgical History:  Procedure Laterality Date   EYE SURGERY       No Known Allergies  Family History  Problem Relation Age of Onset   Asthma Mother    COPD Mother    Hypertension Mother    Cancer Maternal Grandfather        LUNG   Diabetes Maternal Grandfather    Cancer Paternal Grandmother        UTERINE   Breast cancer Neg Hx     Social History   Socioeconomic History   Marital status: Single    Spouse name: Not on file   Number of children: 1   Years of education: Not on file   Highest education level: Not on file  Occupational History   Not on file  Tobacco Use   Smoking status: Never   Smokeless tobacco: Never  Vaping Use   Vaping status: Never Used  Substance and Sexual Activity   Alcohol use: Yes    Comment: occasionally    Drug use: No   Sexual activity: Yes    Partners: Male    Comment: 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89, PARTNERS- 6, 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   Other Topics Concern   Not on file  Social History Narrative   Not on file   Social Drivers of Health   Financial Resource Strain: Not on file  Food Insecurity: Not on file  Transportation Needs: Not on file  Physical Activity: Not on file  Stress: Not on file  Social Connections: Not on file    There were no vitals filed for this visit. There is no height or weight on file to calculate BMI.  Wt Readings from Last 3 Encounters:  12/09/21 184 lb 2 oz (83.5 kg)  07/26/21 189 lb (85.7 kg)  04/24/21 185 lb (83.9 kg)    Physical Exam  ASSESSMENT AND PLAN:  Megan [name] was here today for her annual physical examination.  No orders of the defined types were placed in this encounter.  @ASSESSPLAN @  There are no diagnoses linked to this encounter.  No follow-ups on file.  I, Rolla Etienne Wierda, acting as a scribe for Betty [place], MD., have documented all relevant documentation on the behalf of Betty [place], MD, as directed by  Betty [place], MD while in the presence of Betty [place], MD.   I, Betty [place], MD, have reviewed all documentation  for this visit. The documentation on 06/03/23 for the exam, diagnosis, procedures, and orders are all accurate and complete.  Betty G. [place], MD  [facility]. [place] office.

## 2023-06-16 ENCOUNTER — Ambulatory Visit (INDEPENDENT_AMBULATORY_CARE_PROVIDER_SITE_OTHER): Admitting: Family Medicine

## 2023-06-16 ENCOUNTER — Encounter: Payer: Self-pay | Admitting: Family Medicine

## 2023-06-16 VITALS — BP 135/85 | HR 96 | Temp 97.7°F | Ht 69.0 in | Wt 172.2 lb

## 2023-06-16 DIAGNOSIS — K5190 Ulcerative colitis, unspecified, without complications: Secondary | ICD-10-CM | POA: Diagnosis not present

## 2023-06-16 DIAGNOSIS — R222 Localized swelling, mass and lump, trunk: Secondary | ICD-10-CM | POA: Diagnosis not present

## 2023-06-16 DIAGNOSIS — R252 Cramp and spasm: Secondary | ICD-10-CM

## 2023-06-16 DIAGNOSIS — E1169 Type 2 diabetes mellitus with other specified complication: Secondary | ICD-10-CM

## 2023-06-16 DIAGNOSIS — Z0000 Encounter for general adult medical examination without abnormal findings: Secondary | ICD-10-CM | POA: Diagnosis not present

## 2023-06-16 DIAGNOSIS — I10 Essential (primary) hypertension: Secondary | ICD-10-CM | POA: Diagnosis not present

## 2023-06-16 DIAGNOSIS — G8929 Other chronic pain: Secondary | ICD-10-CM

## 2023-06-16 DIAGNOSIS — M545 Low back pain, unspecified: Secondary | ICD-10-CM | POA: Insufficient documentation

## 2023-06-16 DIAGNOSIS — Z23 Encounter for immunization: Secondary | ICD-10-CM | POA: Diagnosis not present

## 2023-06-16 DIAGNOSIS — Z1211 Encounter for screening for malignant neoplasm of colon: Secondary | ICD-10-CM

## 2023-06-16 DIAGNOSIS — E039 Hypothyroidism, unspecified: Secondary | ICD-10-CM

## 2023-06-16 DIAGNOSIS — E785 Hyperlipidemia, unspecified: Secondary | ICD-10-CM

## 2023-06-16 LAB — MAGNESIUM: Magnesium: 1.8 mg/dL (ref 1.5–2.5)

## 2023-06-16 LAB — TSH: TSH: 1.92 u[IU]/mL (ref 0.35–5.50)

## 2023-06-16 LAB — T4, FREE: Free T4: 0.85 ng/dL (ref 0.60–1.60)

## 2023-06-16 MED ORDER — METHOCARBAMOL 500 MG PO TABS: 500.0000 mg | ORAL_TABLET | Freq: Two times a day (BID) | ORAL | 1 refills | Status: AC | PRN

## 2023-06-16 MED ORDER — ROSUVASTATIN CALCIUM 40 MG PO TABS: 40.0000 mg | ORAL_TABLET | Freq: Every day | ORAL | 3 refills | Status: AC

## 2023-06-16 NOTE — Progress Notes (Signed)
 HPI: Megan [name] is a 53 y.o. female with a PMHx significant for DM II, HLD, HTN, anxiety, UC, and hypothyroidism, who is here today for her routine physical.  Last CPE: 05/09/2020. She had an appointment with her endocrinologist (Dr. Talmage Nap) yesterday.   Exercise: Patient states she walks and uses an exercise machine three times per week.  Diet: She is cooking at home and eating vegetables daily. She admits she is still eating fried foods.  Sleep: She says she is having difficulty sleeping due to menopause. She got 3-4 hours of sleep last night, which was more than normal. Also mentions she has occasional cramps in her legs at night.  Alcohol Use: very seldom Smoking: none Vision: UTD on routine vision care. She says she has been having some trouble with her eyes lately and is wearing her reading glasses more often.  Dental: She is going regularly to a dentist, but notes she is having some dental problems.  Not currently seeing a gynecologist.   Immunization History  Administered Date(s) Administered   Influenza Inj Mdck Quad Pf 01/05/2021   Influenza,inj,Quad PF,6+ Mos 01/30/2020, 12/09/2021   PFIZER(Purple Top)SARS-COV-2 Vaccination 07/12/2019, 08/01/2019   Pneumococcal Polysaccharide-23 05/09/2020   Tdap 05/09/2020   Zoster Recombinant(Shingrix) 12/09/2021   Health Maintenance  Topic Date Due   OPHTHALMOLOGY EXAM  Never done   HIV Screening  Never done   Colonoscopy  Never done   Diabetic kidney evaluation - Urine ACR  10/30/2020   FOOT EXAM  10/30/2020   Pneumococcal Vaccine 44-1 Years old (2 of 2 - PCV) 05/09/2021   HEMOGLOBIN A1C  01/26/2022   Zoster Vaccines- Shingrix (2 of 2) 02/03/2022   Diabetic kidney evaluation - eGFR measurement  12/10/2022   Cervical Cancer Screening (HPV/Pap Cotest)  02/15/2023   COVID-19 Vaccine (3 - 2024-25 season) 06/19/2023 (Originally 11/16/2022)   INFLUENZA VACCINE  10/16/2023   MAMMOGRAM  12/22/2024   DTaP/Tdap/Td (2 - Td  or Tdap) 05/09/2030   Hepatitis C Screening  Completed   HPV VACCINES  Aged Out   Chronic medical problems:   Hypertension:  Medications: Currently on Enalapril 10 mg daily.  BP readings at home: She occasionally checks her BP at home.  Side effects: none Negative for unusual or severe headache, visual changes, exertional chest pain, dyspnea,  focal weakness, or edema.  Lab Results  Component Value Date   CREATININE 0.81 12/09/2021   BUN 14 12/09/2021   NA 132 (L) 12/09/2021   K 4.0 12/09/2021   CL 99 12/09/2021   CO2 23 12/09/2021   Diabetes Mellitus II:  - Medications: Currently on Novolog 80 units max daily, Basaglar 32 units daily, Metformin 750 mg twice daily, and Ozempic 0.5 mg weekly.   Lab Results  Component Value Date   HGBA1C 11.3 (A) 07/26/2021   Lab Results  Component Value Date   MICROALBUR 0.8 10/31/2019   Hyperlipidemia: Currently on rosuvastatin 20 mg daily.  Diet: She eats fried foods and cheese occasionally.  Side effects from medication: none Lab Results  Component Value Date   CHOL 238 (H) 10/31/2019   HDL 39 (L) 10/31/2019   LDLCALC 166 (H) 10/31/2019   TRIG 180 (H) 10/31/2019   CHOLHDL 6.1 (H) 10/31/2019   Hypothyroidism:  Currently on levothyroxine 125 mcg on Mon, Wed, Fri, Sat, Sun and 62.5 mg on Tue, Thu.  Not taking biotin.  Lab Results  Component Value Date   TSH 1.24 12/09/2021   UC:  She has not  been having symptoms of her colitis.  Due for a colonoscopy.   Concerns today:   Patient complains of occasional upper and lower back pain that she has had for many years.  She has tried using a massage machine for the pain.   Review of Systems  Current Outpatient Medications on File Prior to Visit  Medication Sig Dispense Refill   albuterol (VENTOLIN HFA) 108 (90 Base) MCG/ACT inhaler Inhale 2 puffs into the lungs every 6 (six) hours as needed for wheezing or shortness of breath. 6.7 g 1   Continuous Blood Gluc Receiver (DEXCOM G6  RECEIVER) DEVI USE AS DIRECTED 1 each 0   Continuous Blood Gluc Sensor (DEXCOM G7 SENSOR) MISC 1 Device by Does not apply route as directed. Every 10 days 9 each 3   Continuous Blood Gluc Transmit (DEXCOM G6 TRANSMITTER) MISC 1 Device by Does not apply route as directed. 1 each 3   enalapril (VASOTEC) 10 MG tablet TAKE 1 TABLET(10 MG) BY MOUTH DAILY 90 tablet 0   fluticasone (FLONASE) 50 MCG/ACT nasal spray Place 1 spray into both nostrils 2 (two) times daily. 16 g 1   gabapentin (NEURONTIN) 300 MG capsule Take 1 capsule (300 mg total) by mouth at bedtime. 30 capsule 1   hydrOXYzine (VISTARIL) 25 MG capsule Take 1 capsule (25 mg total) by mouth every 8 (eight) hours as needed. 30 capsule 0   insulin aspart (NOVOLOG FLEXPEN) 100 UNIT/ML FlexPen Max daily 80 units 75 mL 3   Insulin Glargine (BASAGLAR KWIKPEN) 100 UNIT/ML ADMINISTER 32 UNITS UNDER THE SKIN DAILY 15 mL 2   Insulin Pen Needle 30G X 5 MM MISC 1 Device by Does not apply route in the morning, at noon, in the evening, and at bedtime. 400 each 3   levothyroxine (SYNTHROID) 125 MCG tablet TAKE 1/2 TABLET BY MOUTH TUESDAYS AND THURSDAYS AND 1 TABLET THE REST OF THE DAYS 90 tablet 0   metFORMIN (GLUCOPHAGE-XR) 750 MG 24 hr tablet Take 1 tablet (750 mg total) by mouth in the morning and at bedtime. 180 tablet 3   omeprazole (PRILOSEC) 20 MG capsule TAKE 1 CAPSULE BY MOUTH EVERY DAY BEFORE BREAKFAST 90 capsule 1   rosuvastatin (CRESTOR) 20 MG tablet TAKE 1 TABLET(20 MG) BY MOUTH DAILY 90 tablet 0   Semaglutide,0.25 or 0.5MG /DOS, (OZEMPIC, 0.25 OR 0.5 MG/DOSE,) 2 MG/1.5ML SOPN Inject 0.5 mg into the skin once a week. 4.5 mL 1   terconazole (TERAZOL 7) 0.4 % vaginal cream Place 1 applicator vaginally at bedtime. 45 g 1   trimethoprim-polymyxin b (POLYTRIM) ophthalmic solution Place 1 drop into both eyes every 4 (four) hours. X 5 days 10 mL 0   No current facility-administered medications on file prior to visit.   Past Medical History:  Diagnosis  Date   Diabetes mellitus without complication (HCC)    Hyperlipidemia    Hypertension    Metabolic syndrome    Ulcerative colitis (HCC)    Ulcerative colitis, acute (HCC)    Past Surgical History:  Procedure Laterality Date   EYE SURGERY     No Known Allergies  Family History  Problem Relation Age of Onset   Asthma Mother    COPD Mother    Hypertension Mother    Cancer Maternal Grandfather        LUNG   Diabetes Maternal Grandfather    Cancer Paternal Grandmother        UTERINE   Breast cancer Neg Hx    Social History  Socioeconomic History   Marital status: Single    Spouse name: Not on file   Number of children: 1   Years of education: Not on file   Highest education level: Not on file  Occupational History   Not on file  Tobacco Use   Smoking status: Never   Smokeless tobacco: Never  Vaping Use   Vaping status: Never Used  Substance and Sexual Activity   Alcohol use: Yes    Comment: occasionally    Drug use: No   Sexual activity: Yes    Partners: Male    Comment: 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38, PARTNERS- 6, 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   Other Topics Concern   Not on file  Social History Narrative   Not on file   Social Drivers of Health   Financial Resource Strain: Not on file  Food Insecurity: Not on file  Transportation Needs: Not on file  Physical Activity: Not on file  Stress: Not on file  Social Connections: Not on file    There were no vitals filed for this visit. There is no height or weight on file to calculate BMI.  Wt Readings from Last 3 Encounters:  12/09/21 184 lb 2 oz (83.5 kg)  07/26/21 189 lb (85.7 kg)  04/24/21 185 lb (83.9 kg)    Physical Exam Vitals and nursing note reviewed.  Constitutional:      General: She is not in acute distress.    Appearance: She is well-developed.  HENT:     Head: Normocephalic and atraumatic.     Right Ear: Hearing, tympanic membrane, ear canal and external ear normal.     Left Ear: Hearing, tympanic  membrane, ear canal and external ear normal.     Mouth/Throat:     Mouth: Mucous membranes are moist.     Pharynx: Oropharynx is clear. Uvula midline.  Eyes:     Extraocular Movements: Extraocular movements intact.     Conjunctiva/sclera: Conjunctivae normal.     Pupils: Pupils are equal, round, and reactive to light.  Neck:     Thyroid: No thyromegaly.     Trachea: No tracheal deviation.     Comments: *** Cardiovascular:     Rate and Rhythm: Normal rate and regular rhythm.     Pulses:          Dorsalis pedis pulses are 2+ on the right side and 2+ on the left side.     Heart sounds: No murmur heard. Pulmonary:     Effort: Pulmonary effort is normal. No respiratory distress.     Breath sounds: Normal breath sounds.  Abdominal:     Palpations: Abdomen is soft. There is no hepatomegaly or mass.     Tenderness: There is no abdominal tenderness.  Genitourinary:    Comments: Deferred to gyn. Musculoskeletal:     Cervical back: Spasms present.     Lumbar back: No tenderness.     Right lower leg: No edema.     Left lower leg: No edema.     Comments: No major deformity or signs of synovitis appreciated.  Lymphadenopathy:     Cervical: No cervical adenopathy.     Upper Body:     Right upper body: No supraclavicular adenopathy.     Left upper body: No supraclavicular adenopathy.  Skin:    General: Skin is warm.     Findings: No erythema or rash.  Neurological:     General: No focal deficit present.     Mental Status: She  is alert and oriented to person, place, and time.     Cranial Nerves: No cranial nerve deficit.     Coordination: Coordination normal.     Gait: Gait normal.     Deep Tendon Reflexes:     Reflex Scores:      Bicep reflexes are 2+ on the right side and 2+ on the left side.      Patellar reflexes are 2+ on the right side and 2+ on the left side. Psychiatric:     Comments: Well groomed, good eye contact.     ASSESSMENT AND PLAN:  Ms. Luana Tatro  was here today for her annual physical examination.  No orders of the defined types were placed in this encounter.   @ASSESSPLAN @  There are no diagnoses linked to this encounter.  No follow-ups on file.  I, Rolla Etienne Wierda, acting as a scribe for Jamaiyah Pyle [place], MD., have documented all relevant documentation on the behalf of Graeson Nouri [place], MD, as directed by  Syana Degraffenreid [place], MD while in the presence of Yurika Pereda [place], MD.   I, Edin Kon [place], MD, have reviewed all documentation for this visit. The documentation on 06/16/23 for the exam, diagnosis, procedures, and orders are all accurate and complete.  Evolette Pendell G. [place], MD  [facility]. [place] office.

## 2023-06-16 NOTE — Patient Instructions (Addendum)
 A few things to remember from today's visit:  Routine general medical examination at a health care facility  Hypertension, essential, benign  Dyslipidemia - Plan: rosuvastatin (CRESTOR) 40 MG tablet  Ulcerative colitis without complications, unspecified location (HCC)  Hypothyroidism (acquired) - Plan: T4, free, TSH  Colon cancer screening - Plan: Ambulatory referral to Gastroenterology  Leg cramps - Plan: Magnesium  Supraclavicular fossa fullness - Plan: US SOFT TISSUE HEAD & NECK (NON-THYROID)  Chronic bilateral low back pain without sciatica - Plan: methocarbamol (ROBAXIN) 500 MG tablet  Rosuvastatin dose increased. Rest unchanged. Lipid lab in 3-4 months.  Monitor blood pressure daily for the next 2 weeks and let me know about readings.   If you need refills for medications you take chronically, please call your pharmacy. Do not use My Chart to request refills or for acute issues that need immediate attention. If you send a my chart message, it may take a few days to be addressed, specially if I am not in the office.  Please be sure medication list is accurate. If a new problem present, please set up appointment sooner than planned today.  Health Maintenance, Female Adopting a healthy lifestyle and getting preventive care are important in promoting health and wellness. Ask your health care provider about: The right schedule for you to have regular tests and exams. Things you can do on your own to prevent diseases and keep yourself healthy. What should I know about diet, weight, and exercise? Eat a healthy diet  Eat a diet that includes plenty of vegetables, fruits, low-fat dairy products, and lean protein. Do not eat a lot of foods that are high in solid fats, added sugars, or sodium. Maintain a healthy weight Body mass index (BMI) is used to identify weight problems. It estimates body fat based on height and weight. Your health care provider can help determine your  BMI and help you achieve or maintain a healthy weight. Get regular exercise Get regular exercise. This is one of the most important things you can do for your health. Most adults should: Exercise for at least 150 minutes each week. The exercise should increase your heart rate and make you sweat (moderate-intensity exercise). Do strengthening exercises at least twice a week. This is in addition to the moderate-intensity exercise. Spend less time sitting. Even light physical activity can be beneficial. Watch cholesterol and blood lipids Have your blood tested for lipids and cholesterol at 54 years of age, then have this test every 5 years. Have your cholesterol levels checked more often if: Your lipid or cholesterol levels are high. You are older than 53 years of age. You are at high risk for heart disease. What should I know about cancer screening? Depending on your health history and family history, you may need to have cancer screening at various ages. This may include screening for: Breast cancer. Cervical cancer. Colorectal cancer. Skin cancer. Lung cancer. What should I know about heart disease, diabetes, and high blood pressure? Blood pressure and heart disease High blood pressure causes heart disease and increases the risk of stroke. This is more likely to develop in people who have high blood pressure readings or are overweight. Have your blood pressure checked: Every 3-5 years if you are 69-10 years of age. Every year if you are 75 years old or older. Diabetes Have regular diabetes screenings. This checks your fasting blood sugar level. Have the screening done: Once every three years after age 77 if you are at a normal weight  and have a low risk for diabetes. More often and at a younger age if you are overweight or have a high risk for diabetes. What should I know about preventing infection? Hepatitis B If you have a higher risk for hepatitis B, you should be screened for this  virus. Talk with your health care provider to find out if you are at risk for hepatitis B infection. Hepatitis C Testing is recommended for: Everyone born from [birth year] through 1965. Anyone with known risk factors for hepatitis C. Sexually transmitted infections (STIs) Get screened for STIs, including gonorrhea and chlamydia, if: You are sexually active and are younger than 53 years of age. You are older than 53 years of age and your health care provider tells you that you are at risk for this type of infection. Your sexual activity has changed since you were last screened, and you are at increased risk for chlamydia or gonorrhea. Ask your health care provider if you are at risk. Ask your health care provider about whether you are at high risk for HIV. Your health care provider may recommend a prescription medicine to help prevent HIV infection. If you choose to take medicine to prevent HIV, you should first get tested for HIV. You should then be tested every 3 months for as long as you are taking the medicine. Pregnancy If you are about to stop having your period (premenopausal) and you may become pregnant, seek counseling before you get pregnant. Take 400 to 800 micrograms (mcg) of folic acid every day if you become pregnant. Ask for birth control (contraception) if you want to prevent pregnancy. Osteoporosis and menopause Osteoporosis is a disease in which the bones lose minerals and strength with aging. This can result in bone fractures. If you are 40 years old or older, or if you are at risk for osteoporosis and fractures, ask your health care provider if you should: Be screened for bone loss. Take a calcium or vitamin D supplement to lower your risk of fractures. Be given hormone replacement therapy (HRT) to treat symptoms of menopause. Follow these instructions at home: Alcohol use Do not drink alcohol if: Your health care provider tells you not to drink. You are pregnant, may be pregnant,  or are planning to become pregnant. If you drink alcohol: Limit how much you have to: 0-1 drink a day. Know how much alcohol is in your drink. In the [place] one drink equals one 12 oz bottle of beer (355 mL), one 5 oz glass of wine (148 mL), or one 1 oz glass of hard liquor (44 mL). Lifestyle Do not use any products that contain nicotine or tobacco. These products include cigarettes, chewing tobacco, and vaping devices, such as e-cigarettes. If you need help quitting, ask your health care provider. Do not use street drugs. Do not share needles. Ask your health care provider for help if you need support or information about quitting drugs. General instructions Schedule regular health, dental, and eye exams. Stay current with your vaccines. Tell your health care provider if: You often feel depressed. You have ever been abused or do not feel safe at home. Summary Adopting a healthy lifestyle and getting preventive care are important in promoting health and wellness. Follow your health care provider's instructions about healthy diet, exercising, and getting tested or screened for diseases. Follow your health care provider's instructions on monitoring your cholesterol and blood pressure. This information is not intended to replace advice given to you by your health care provider.  Make sure you discuss any questions you have with your health care provider. Document Revised: 07/23/2020 Document Reviewed: 07/23/2020 Elsevier Patient Education  2024 [employer].

## 2023-06-17 ENCOUNTER — Encounter: Payer: Self-pay | Admitting: Family Medicine

## 2023-06-18 ENCOUNTER — Other Ambulatory Visit

## 2023-06-18 ENCOUNTER — Encounter: Payer: Self-pay | Admitting: Family Medicine

## 2023-06-18 MED ORDER — LEVOTHYROXINE SODIUM 125 MCG PO TABS: ORAL_TABLET | ORAL | 3 refills | Status: AC

## 2023-06-18 NOTE — Assessment & Plan Note (Signed)
 Last TSH 1.2 in 11/2021. Continue Levothyroxine 125 mcg daily. Further recommendations according to TSH result.

## 2023-06-18 NOTE — Assessment & Plan Note (Signed)
 We discussed the importance of regular physical activity and healthy diet for prevention of chronic illness and/or complications. Preventive guidelines reviewed. Vaccination updated. Ca++ and vit D supplementation to continue. She is planning on establishing with gynecologist. Next CPE in a year.

## 2023-06-18 NOTE — Assessment & Plan Note (Signed)
 Has been in remission for years. Due for colonoscopy, GI referral placed.

## 2023-06-18 NOTE — Assessment & Plan Note (Signed)
 BP mildly elevated today. Recommend  to let me know about BP readings at home in 2 weeks. For now continue Enalapril 10 mg daily and low salt diet.

## 2023-06-18 NOTE — Assessment & Plan Note (Deleted)
 LDL 129 on 06/15/23. Recommend increasing dose of Rosuvastatin from 20 mg to 40 mg daily. Continue low fat diet. FLP in 3-4 months.

## 2023-06-18 NOTE — Assessment & Plan Note (Signed)
 Stable overall. Methocarbamol 500 mg bid prn, side effects discussed. Local massage and topical icy hot may also help.

## 2023-06-18 NOTE — Assessment & Plan Note (Signed)
 LDL 129 on 06/15/23. Recommend increasing dose of Rosuvastatin from 20 mg to 40 mg daily. Continue low fat diet. FLP in 3-4 months.

## 2023-06-23 ENCOUNTER — Inpatient Hospital Stay: Admission: RE | Admit: 2023-06-23 | Source: Ambulatory Visit

## 2023-07-27 ENCOUNTER — Encounter: Payer: Self-pay | Admitting: Family Medicine

## 2023-10-04 ENCOUNTER — Encounter: Payer: Self-pay | Admitting: Family Medicine

## 2023-10-04 DIAGNOSIS — N959 Unspecified menopausal and perimenopausal disorder: Secondary | ICD-10-CM

## 2023-11-25 ENCOUNTER — Encounter: Payer: Self-pay | Admitting: Family Medicine

## 2023-11-26 ENCOUNTER — Ambulatory Visit: Admitting: Family Medicine

## 2023-11-26 NOTE — Telephone Encounter (Signed)
 Patient called back requesting a refill. Says she knows the issue and is asking for a refill as previously prescribed by PCP. Was scheduled for VV today but provider uncomfortable treating virtually. Requesting a call.

## 2023-12-02 ENCOUNTER — Ambulatory Visit (INDEPENDENT_AMBULATORY_CARE_PROVIDER_SITE_OTHER): Admitting: Obstetrics and Gynecology

## 2023-12-02 ENCOUNTER — Ambulatory Visit: Payer: Self-pay | Admitting: Obstetrics and Gynecology

## 2023-12-02 ENCOUNTER — Encounter: Payer: Self-pay | Admitting: Obstetrics and Gynecology

## 2023-12-02 VITALS — BP 136/80 | HR 98 | Ht 69.0 in | Wt 185.0 lb

## 2023-12-02 DIAGNOSIS — R300 Dysuria: Secondary | ICD-10-CM

## 2023-12-02 DIAGNOSIS — Z1231 Encounter for screening mammogram for malignant neoplasm of breast: Secondary | ICD-10-CM

## 2023-12-02 DIAGNOSIS — N898 Other specified noninflammatory disorders of vagina: Secondary | ICD-10-CM | POA: Insufficient documentation

## 2023-12-02 DIAGNOSIS — N951 Menopausal and female climacteric states: Secondary | ICD-10-CM

## 2023-12-02 DIAGNOSIS — K5100 Ulcerative (chronic) pancolitis without complications: Secondary | ICD-10-CM

## 2023-12-02 DIAGNOSIS — E0800 Diabetes mellitus due to underlying condition with hyperosmolarity without nonketotic hyperglycemic-hyperosmolar coma (NKHHC): Secondary | ICD-10-CM

## 2023-12-02 LAB — URINALYSIS, COMPLETE W/RFL CULTURE
Bacteria, UA: NONE SEEN /HPF
Bilirubin Urine: NEGATIVE
Hgb urine dipstick: NEGATIVE
Hyaline Cast: NONE SEEN /LPF
Ketones, ur: NEGATIVE
Leukocyte Esterase: NEGATIVE
Nitrites, Initial: NEGATIVE
Protein, ur: NEGATIVE
RBC / HPF: NONE SEEN /HPF (ref 0–2)
Specific Gravity, Urine: 1.015 (ref 1.001–1.035)
WBC, UA: NONE SEEN /HPF (ref 0–5)
pH: 6 (ref 5.0–8.0)

## 2023-12-02 LAB — NO CULTURE INDICATED

## 2023-12-02 MED ORDER — BIJUVA 0.5-100 MG PO CAPS: 1.0000 | ORAL_CAPSULE | Freq: Every evening | ORAL | 6 refills | Status: DC

## 2023-12-02 MED ORDER — FLUCONAZOLE 150 MG PO TABS: 150.0000 mg | ORAL_TABLET | Freq: Once | ORAL | 1 refills | Status: AC

## 2023-12-02 NOTE — Progress Notes (Signed)
   Acute Office Visit  Subjective:    Patient ID: Megan [name], female    DOB: 1970/07/11, 53 y.o.   MRN: [id number]   HPI 53 y.o. presents today for office visit (Itching inside the vagina, no odor, irritation x over 1 week /No period for 4 months/IC, CCMA) . Reports insomnia, hot flashes and some fatigue Would be interested in starting HRT No h/o DVT or CHTN Does have DM and states her sugar is at 200 now. She does get frequent yeast infections and would like to have refills on the diflucan .  No LMP recorded.    Review of Systems     Objective:    OBGyn Exam  BP 136/80 (BP Location: Left Arm, Patient Position: Sitting, Cuff Size: Normal)   Pulse 98   Ht 5' 9 (1.753 m)   Wt 185 lb (83.9 kg)   SpO2 99%   BMI 27.32 kg/m  Wt Readings from Last 3 Encounters:  12/02/23 185 lb (83.9 kg)  06/16/23 172 lb 3.2 oz (78.1 kg)  12/09/21 184 lb 2 oz (83.5 kg)       SVE: likely yeast infection, no lesions Issa CMA was present for the entire exam  Assessment & Plan:  Vaginal pruritus Nu swab sent. Refills sent on diflucan  HRT. Counseled on the r/b/a/I of HRT use.  Discussed that she will need progesterone to avoid unopposed estrogen on the endometrial lining and risk for endometrial cancer. Discussed lower risk for DVT and stroke with the patch.Side effects include risk of breast tenderness and spotting along with low risk of blood clots and stroke with uncontrolled hypertension. Counseled on the benefits to help improve the bone density during menopause.  To begin 0.5/100 bijuva  and will return for annual exam along with HRT follow-up Referral for mammogram and colonoscopy placed.  20 minutes spent on reviewing records, imaging,  and one on one patient time and counseling patient and documentation Dr. Glennon Almarie [name] Megan

## 2023-12-03 LAB — SURESWAB® ADVANCED VAGINITIS PLUS,TMA
C. trachomatis RNA, TMA: NOT DETECTED
CANDIDA SPECIES: NOT DETECTED
Candida glabrata: DETECTED — AB
N. gonorrhoeae RNA, TMA: NOT DETECTED
SURESWAB(R) ADV BACTERIAL VAGINOSIS(BV),TMA: NEGATIVE
TRICHOMONAS VAGINALIS (TV),TMA: NOT DETECTED

## 2023-12-04 MED ORDER — FLUCONAZOLE 150 MG PO TABS: ORAL_TABLET | ORAL | 0 refills | Status: AC

## 2023-12-04 NOTE — Telephone Encounter (Signed)
 I have spoken with pt and made her aware.

## 2023-12-04 NOTE — Telephone Encounter (Signed)
 Received a PA request from covermymeds for Estradiol   Key: [id number]: N95.1  Submitted and sent pt a mychart message to make her aware.

## 2023-12-04 NOTE — Telephone Encounter (Signed)
 Pt is aware of the results and that the medication has been sent into the pharmacy.   Pt wanted to know if you could send in an additional RX on Diflucan  since she is a diabetic and gets these often?   Please advise

## 2023-12-04 NOTE — Telephone Encounter (Signed)
 PA has been approved. Pt is aware.    Approved today by [facility] NCPDP 2017 Your PA request has been approved. Additional information will be provided in the approval communication. (Message 1145) Effective Date: 12/04/2023 Authorization Expiration Date: 12/03/2024

## 2023-12-04 NOTE — Telephone Encounter (Signed)
 I have submitted a PA will see how that turns out with insurance then go thru [facility] recommendation.

## 2023-12-08 ENCOUNTER — Ambulatory Visit (INDEPENDENT_AMBULATORY_CARE_PROVIDER_SITE_OTHER): Admitting: Nurse Practitioner

## 2023-12-08 ENCOUNTER — Other Ambulatory Visit (HOSPITAL_COMMUNITY)
Admission: RE | Admit: 2023-12-08 | Discharge: 2023-12-08 | Disposition: A | Source: Ambulatory Visit | Attending: Nurse Practitioner | Admitting: Nurse Practitioner

## 2023-12-08 ENCOUNTER — Encounter: Payer: Self-pay | Admitting: Nurse Practitioner

## 2023-12-08 VITALS — BP 112/70 | HR 99 | Ht 69.0 in | Wt 184.0 lb

## 2023-12-08 DIAGNOSIS — Z01419 Encounter for gynecological examination (general) (routine) without abnormal findings: Secondary | ICD-10-CM | POA: Diagnosis present

## 2023-12-08 DIAGNOSIS — B379 Candidiasis, unspecified: Secondary | ICD-10-CM | POA: Diagnosis not present

## 2023-12-08 DIAGNOSIS — N951 Menopausal and female climacteric states: Secondary | ICD-10-CM | POA: Diagnosis not present

## 2023-12-08 DIAGNOSIS — Z79890 Hormone replacement therapy (postmenopausal): Secondary | ICD-10-CM

## 2023-12-08 DIAGNOSIS — Z124 Encounter for screening for malignant neoplasm of cervix: Secondary | ICD-10-CM | POA: Diagnosis present

## 2023-12-08 DIAGNOSIS — Z1331 Encounter for screening for depression: Secondary | ICD-10-CM | POA: Diagnosis not present

## 2023-12-08 MED ORDER — BORIC ACID VAGINAL 600 MG VA SUPP: 1.0000 | Freq: Every evening | VAGINAL | 0 refills | Status: AC

## 2023-12-08 MED ORDER — BIJUVA 0.5-100 MG PO CAPS: 1.0000 | ORAL_CAPSULE | Freq: Every evening | ORAL | 2 refills | Status: DC

## 2023-12-08 NOTE — Progress Notes (Signed)
 Megan [name] Feb 23, 1971 [id number]   History:  53 y.o. [id number] presents for annual exam. Perimenopausal. LMP 4 months ago. Just started on HRT for hot flashes, night sweats and fatigue. Has noticed improvement in symptoms already. Normal pap history.  History of recurrent yeast infections. + candida Glabrata 12/02/23. Treated with Diflucan . T2DM managed by endocrinology.  Gynecologic History Patient's last menstrual period was 07/20/2023 (approximate).   Contraception/Family planning: condoms Sexually active: No  Health Maintenance Last Pap: 02/15/2020. Results were: Normal Last mammogram: 12/23/2022. Results were: Normal Last colonoscopy: Never Last Dexa: Not indicated     12/08/2023    3:18 PM  Depression screen PHQ 2/9  Decreased Interest 0  Down, Depressed, Hopeless 0  PHQ - 2 Score 0     Past medical history, past surgical history, family history and social history were all reviewed and documented in the EPIC chart. Single. From [place]. 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   ROS:  A ROS was performed and pertinent positives and negatives are included.  Exam:  Vitals:   12/08/23 1520  BP: 112/70  Pulse: 99  SpO2: 97%  Weight: 184 lb (83.5 kg)  Height: 5' 9 (1.753 m)    Body mass index is 27.17 kg/m.  General appearance:  Normal Thyroid :  Symmetrical, normal in size, without palpable masses or nodularity. Respiratory  Auscultation:  Clear without wheezing or rhonchi Cardiovascular  Auscultation:  Regular rate, without rubs, murmurs or gallops  Edema/varicosities:  Not grossly evident Abdominal  Soft,nontender, without masses, guarding or rebound.  Liver/spleen:  No organomegaly noted  Hernia:  None appreciated  Skin  Inspection:  Grossly normal   Breasts: Examined lying and sitting.   Right: Without masses, retractions, discharge or axillary adenopathy.   Left: Without masses, retractions, discharge or axillary adenopathy. Pelvic: External genitalia:  no  lesions              Urethra:  normal appearing urethra with no masses, tenderness or lesions              Bartholins and Skenes: normal                 Vagina: normal appearing vagina with normal color and discharge, no lesions              Cervix: no lesions Bimanual Exam:  Uterus:  no masses or tenderness              Adnexa: no mass, fullness, tenderness              Rectovaginal: Deferred              Anus:  normal, no lesions  Clotilda Pa, CMA present as chaperone.   Assessment/Plan:  53 y.o. [id number] for annual exam.   Well female exam with routine gynecological exam - Education provided on SBEs, importance of preventative screenings, current guidelines, high calcium  diet, regular exercise, and multivitamin daily. Labs with endocrinology and PCP.   Candida glabrata infection - Plan: Boric Acid Vaginal 600 MG SUPP nightly x 21 days. Discussed Glabrata and treatment options.   Perimenopausal symptoms - Plan: Estradiol-Progesterone (BIJUVA ) 0.5-100 MG CAPS nightly. Has noticed improvement in symptoms. Aware sent to specialty pharmacy.   Hormone replacement therapy - Plan: Estradiol-Progesterone (BIJUVA ) 0.5-100 MG CAPS nightly.   Cervical cancer screening - Plan: Cytology - PAP( [facility]). Normal pap history. Pap today per guidelines.   Screening for breast cancer - Normal mammogram history.  Continue annual screenings.  Normal breast exam today.  Screening for colon cancer - Discussed current guidelines and importance of preventative screenings.   Return in about 1 year (around 12/07/2024) for Annual.        Annabella DELENA Shutter [facility], 3:49 PM 12/08/2023

## 2023-12-09 ENCOUNTER — Other Ambulatory Visit: Payer: Self-pay | Admitting: Family Medicine

## 2023-12-09 DIAGNOSIS — K219 Gastro-esophageal reflux disease without esophagitis: Secondary | ICD-10-CM

## 2023-12-10 ENCOUNTER — Ambulatory Visit: Payer: Self-pay | Admitting: Nurse Practitioner

## 2023-12-10 LAB — CYTOLOGY - PAP
Adequacy: ABSENT
Comment: NEGATIVE
Diagnosis: NEGATIVE
High risk HPV: NEGATIVE

## 2023-12-25 ENCOUNTER — Other Ambulatory Visit: Payer: Self-pay | Admitting: Nurse Practitioner

## 2023-12-25 ENCOUNTER — Encounter: Payer: Self-pay | Admitting: Nurse Practitioner

## 2023-12-25 DIAGNOSIS — Z79890 Hormone replacement therapy (postmenopausal): Secondary | ICD-10-CM

## 2023-12-25 MED ORDER — ESTRADIOL 0.0375 MG/24HR TD PTWK: 0.0375 mg | MEDICATED_PATCH | TRANSDERMAL | 1 refills | Status: DC

## 2023-12-25 MED ORDER — PROGESTERONE MICRONIZED 100 MG PO CAPS: 100.0000 mg | ORAL_CAPSULE | Freq: Every day | ORAL | 1 refills | Status: DC

## 2023-12-28 ENCOUNTER — Other Ambulatory Visit: Payer: Self-pay

## 2023-12-28 DIAGNOSIS — Z79890 Hormone replacement therapy (postmenopausal): Secondary | ICD-10-CM

## 2023-12-28 MED ORDER — ESTRADIOL 0.0375 MG/24HR TD PTWK: 0.0375 mg | MEDICATED_PATCH | TRANSDERMAL | 2 refills | Status: DC

## 2023-12-28 NOTE — Telephone Encounter (Signed)
 Medication refill request: estradiol tds 0.0375mg  patches Last AEX:  12-08-23 Next AEX: not scheduled Last MMG (if hormonal medication request): 12-23-22 birads 1:neg, scheduled for 04-03-23 Refill authorized: rx last sent on 12-25-23 #4 with 1 refill. Pharmacy is requesting a 90 day refill instead. Please approve if appropriate.

## 2024-01-01 ENCOUNTER — Ambulatory Visit
Admission: RE | Admit: 2024-01-01 | Discharge: 2024-01-01 | Disposition: A | Discharge status: Home / Self Care | Source: Ambulatory Visit | Attending: Obstetrics and Gynecology | Admitting: Obstetrics and Gynecology

## 2024-01-01 DIAGNOSIS — Z1231 Encounter for screening mammogram for malignant neoplasm of breast: Secondary | ICD-10-CM

## 2024-01-22 ENCOUNTER — Other Ambulatory Visit: Payer: Self-pay

## 2024-01-22 DIAGNOSIS — Z79890 Hormone replacement therapy (postmenopausal): Secondary | ICD-10-CM

## 2024-01-22 MED ORDER — ESTRADIOL 0.0375 MG/24HR TD PTWK: 0.0375 mg | MEDICATED_PATCH | TRANSDERMAL | 2 refills | Status: AC

## 2024-01-22 NOTE — Telephone Encounter (Signed)
 Received medication refill request via fax: The patient is requested authorization to dispense a 90 day supply.  Med refill request: estradiol TDS 0.0375 mg patches 4S Last AEX:  12/08/2023 TW Next AEX: not yet scheduled Last MMG (if hormonal med) 01/01/24 Refill authorized: Please Advise? Last rx sent estradiol (climara) 0.0375mg /24 hr patch #12 with 2 refills on 12/28/23. TW

## 2024-02-07 ENCOUNTER — Other Ambulatory Visit: Payer: Self-pay | Admitting: Family Medicine

## 2024-02-07 DIAGNOSIS — I10 Essential (primary) hypertension: Secondary | ICD-10-CM

## 2024-02-26 ENCOUNTER — Other Ambulatory Visit: Payer: Self-pay | Admitting: Nurse Practitioner

## 2024-02-26 DIAGNOSIS — Z79890 Hormone replacement therapy (postmenopausal): Secondary | ICD-10-CM

## 2024-02-26 NOTE — Telephone Encounter (Signed)
 Med refill request:   progesterone  (PROMETRIUM ) 100 MG capsule  Start:  12/25/23 Disp: 30 capsules Refills:  1  Last AEX:  12/08/23 Next AEX:  Not yet scheduled Last MMG (if hormonal med):  01/01/24 Refill authorized? Please Advise.

## 2024-03-18 ENCOUNTER — Encounter: Payer: Self-pay | Admitting: Internal Medicine

## 2024-03-21 ENCOUNTER — Encounter: Admitting: Family Medicine

## 2024-04-07 ENCOUNTER — Telehealth: Payer: Self-pay | Admitting: Family Medicine

## 2024-04-07 NOTE — Telephone Encounter (Signed)
 Copied from CRM #[id number]. Topic: General - Call Back - No Documentation >> Apr 07, 2024  8:28 AM Ivette P wrote: Reason for CRM: PT called in due to a missed call. Advised dont see anything on chart for this pt.   PT insists someone reached out and would  like a follow up call as to why.

## 2024-04-08 NOTE — Telephone Encounter (Signed)
 Spoke with the patient and she figured out who was calling-it was for her mom.

## 2024-04-08 NOTE — Telephone Encounter (Signed)
 Called the patient but no answer and patient does not have a voicemail set up.

## 2024-04-18 ENCOUNTER — Encounter: Payer: Self-pay | Admitting: Nurse Practitioner

## 2024-04-22 NOTE — Telephone Encounter (Signed)
 Pt is scheduled with Tiffany on 04/27/24

## 2024-04-27 ENCOUNTER — Ambulatory Visit: Admitting: Nurse Practitioner

## 2024-06-20 ENCOUNTER — Encounter: Admitting: Family Medicine
